# Patient Record
Sex: Female | Born: 1970 | Race: White | Hispanic: No | Marital: Married | Smoking: Never smoker
Health system: Southern US, Community
[De-identification: ages and names within clinical notes are randomized; demographics above are authoritative.]

## PROBLEM LIST (undated history)

## (undated) DIAGNOSIS — I639 Cerebral infarction, unspecified: Secondary | ICD-10-CM

## (undated) DIAGNOSIS — I10 Essential (primary) hypertension: Secondary | ICD-10-CM

---

## 2017-01-25 ENCOUNTER — Other Ambulatory Visit: Payer: Self-pay | Admitting: Orthopedic Surgery

## 2017-01-25 DIAGNOSIS — R531 Weakness: Secondary | ICD-10-CM

## 2017-01-25 DIAGNOSIS — R52 Pain, unspecified: Secondary | ICD-10-CM

## 2017-01-30 ENCOUNTER — Ambulatory Visit
Admission: RE | Admit: 2017-01-30 | Discharge: 2017-01-30 | Disposition: A | Discharge status: Home / Self Care | Payer: BLUE CROSS/BLUE SHIELD | Source: Ambulatory Visit | Attending: Orthopedic Surgery | Admitting: Orthopedic Surgery

## 2017-01-30 DIAGNOSIS — R52 Pain, unspecified: Secondary | ICD-10-CM

## 2017-01-30 DIAGNOSIS — R531 Weakness: Secondary | ICD-10-CM

## 2020-03-01 ENCOUNTER — Observation Stay (HOSPITAL_COMMUNITY): Payer: Managed Care, Other (non HMO)

## 2020-03-01 ENCOUNTER — Other Ambulatory Visit: Payer: Self-pay

## 2020-03-01 ENCOUNTER — Encounter: Payer: Self-pay | Admitting: Physician Assistant

## 2020-03-01 ENCOUNTER — Emergency Department (HOSPITAL_COMMUNITY): Payer: Managed Care, Other (non HMO)

## 2020-03-01 ENCOUNTER — Observation Stay (HOSPITAL_COMMUNITY)
Admission: EM | Admit: 2020-03-01 | Discharge: 2020-03-02 | Disposition: A | Discharge status: Home / Self Care | Payer: Managed Care, Other (non HMO) | Attending: Family Medicine | Admitting: Family Medicine

## 2020-03-01 ENCOUNTER — Ambulatory Visit
Admission: EM | Admit: 2020-03-01 | Discharge: 2020-03-01 | Disposition: A | Discharge status: Home / Self Care | Payer: Managed Care, Other (non HMO) | Source: Home / Self Care

## 2020-03-01 ENCOUNTER — Encounter (HOSPITAL_COMMUNITY): Payer: Self-pay | Admitting: Emergency Medicine

## 2020-03-01 DIAGNOSIS — Z20822 Contact with and (suspected) exposure to covid-19: Secondary | ICD-10-CM | POA: Diagnosis not present

## 2020-03-01 DIAGNOSIS — I639 Cerebral infarction, unspecified: Secondary | ICD-10-CM | POA: Diagnosis present

## 2020-03-01 DIAGNOSIS — E876 Hypokalemia: Secondary | ICD-10-CM | POA: Diagnosis present

## 2020-03-01 DIAGNOSIS — Z885 Allergy status to narcotic agent status: Secondary | ICD-10-CM | POA: Diagnosis not present

## 2020-03-01 DIAGNOSIS — I10 Essential (primary) hypertension: Secondary | ICD-10-CM | POA: Diagnosis not present

## 2020-03-01 DIAGNOSIS — I6389 Other cerebral infarction: Secondary | ICD-10-CM | POA: Diagnosis not present

## 2020-03-01 DIAGNOSIS — R82998 Other abnormal findings in urine: Secondary | ICD-10-CM | POA: Insufficient documentation

## 2020-03-01 DIAGNOSIS — R8290 Unspecified abnormal findings in urine: Secondary | ICD-10-CM | POA: Diagnosis present

## 2020-03-01 DIAGNOSIS — Z79899 Other long term (current) drug therapy: Secondary | ICD-10-CM | POA: Diagnosis not present

## 2020-03-01 DIAGNOSIS — R29810 Facial weakness: Secondary | ICD-10-CM | POA: Insufficient documentation

## 2020-03-01 HISTORY — DX: Essential (primary) hypertension: I10

## 2020-03-01 HISTORY — DX: Cerebral infarction, unspecified: I63.9

## 2020-03-01 LAB — URINALYSIS, ROUTINE W REFLEX MICROSCOPIC
Bilirubin Urine: NEGATIVE
Glucose, UA: NEGATIVE mg/dL
Hgb urine dipstick: NEGATIVE
Ketones, ur: NEGATIVE mg/dL
Leukocytes,Ua: NEGATIVE
Nitrite: POSITIVE — AB
Protein, ur: NEGATIVE mg/dL
Specific Gravity, Urine: 1.006 (ref 1.005–1.030)
pH: 7 (ref 5.0–8.0)

## 2020-03-01 LAB — RAPID URINE DRUG SCREEN, HOSP PERFORMED
Amphetamines: NOT DETECTED
Barbiturates: NOT DETECTED
Benzodiazepines: NOT DETECTED
Cocaine: NOT DETECTED
Opiates: NOT DETECTED
Tetrahydrocannabinol: NOT DETECTED

## 2020-03-01 LAB — CBC WITH DIFFERENTIAL/PLATELET
Abs Immature Granulocytes: 0.02 10*3/uL (ref 0.00–0.07)
Basophils Absolute: 0 10*3/uL (ref 0.0–0.1)
Basophils Relative: 1 %
Eosinophils Absolute: 0.1 10*3/uL (ref 0.0–0.5)
Eosinophils Relative: 1 %
HCT: 40.4 % (ref 36.0–46.0)
Hemoglobin: 13.5 g/dL (ref 12.0–15.0)
Immature Granulocytes: 0 %
Lymphocytes Relative: 15 %
Lymphs Abs: 1 10*3/uL (ref 0.7–4.0)
MCH: 30.2 pg (ref 26.0–34.0)
MCHC: 33.4 g/dL (ref 30.0–36.0)
MCV: 90.4 fL (ref 80.0–100.0)
Monocytes Absolute: 0.5 10*3/uL (ref 0.1–1.0)
Monocytes Relative: 8 %
Neutro Abs: 5 10*3/uL (ref 1.7–7.7)
Neutrophils Relative %: 75 %
Platelets: 308 10*3/uL (ref 150–400)
RBC: 4.47 MIL/uL (ref 3.87–5.11)
RDW: 12.9 % (ref 11.5–15.5)
WBC: 6.6 10*3/uL (ref 4.0–10.5)
nRBC: 0 % (ref 0.0–0.2)

## 2020-03-01 LAB — HIV ANTIBODY (ROUTINE TESTING W REFLEX): HIV Screen 4th Generation wRfx: NONREACTIVE

## 2020-03-01 LAB — BASIC METABOLIC PANEL
Anion gap: 9 (ref 5–15)
BUN: 12 mg/dL (ref 6–20)
CO2: 24 mmol/L (ref 22–32)
Calcium: 8.7 mg/dL — ABNORMAL LOW (ref 8.9–10.3)
Chloride: 107 mmol/L (ref 98–111)
Creatinine, Ser: 0.59 mg/dL (ref 0.44–1.00)
GFR calc Af Amer: >60 mL/min (ref >60)
GFR calc non Af Amer: >60 mL/min (ref >60)
Glucose, Bld: 131 mg/dL — ABNORMAL HIGH (ref 70–99)
Potassium: 3.3 mmol/L — ABNORMAL LOW (ref 3.5–5.1)
Sodium: 140 mmol/L (ref 135–145)

## 2020-03-01 LAB — SARS CORONAVIRUS 2 (TAT 6-24 HRS): SARS Coronavirus 2: NEGATIVE

## 2020-03-01 MED ORDER — ASPIRIN EC 81 MG PO TBEC
81.0000 mg | DELAYED_RELEASE_TABLET | Freq: Every day | ORAL | Status: DC
  Administered 2020-03-01 – 2020-03-02 (×2): 81 mg via ORAL
  Filled 2020-03-01 (×2): qty 1

## 2020-03-01 MED ORDER — ACETAMINOPHEN 650 MG RE SUPP: 650.0000 mg | RECTAL | Status: DC | PRN

## 2020-03-01 MED ORDER — IOHEXOL 350 MG/ML SOLN
50.0000 mL | Freq: Once | INTRAVENOUS | Status: AC | PRN
  Administered 2020-03-01: 50 mL via INTRAVENOUS

## 2020-03-01 MED ORDER — ACETAMINOPHEN 325 MG PO TABS: 650.0000 mg | ORAL_TABLET | ORAL | Status: DC | PRN

## 2020-03-01 MED ORDER — POTASSIUM CHLORIDE 20 MEQ PO PACK
20.0000 meq | PACK | Freq: Once | ORAL | Status: AC
  Administered 2020-03-01: 20 meq via ORAL
  Filled 2020-03-01 (×2): qty 1

## 2020-03-01 MED ORDER — ATORVASTATIN CALCIUM 80 MG PO TABS
80.0000 mg | ORAL_TABLET | Freq: Every day | ORAL | Status: DC
  Administered 2020-03-01: 80 mg via ORAL
  Filled 2020-03-01: qty 1

## 2020-03-01 MED ORDER — ASPIRIN 325 MG PO TABS
325.0000 mg | ORAL_TABLET | Freq: Once | ORAL | Status: AC
  Administered 2020-03-01: 325 mg via ORAL
  Filled 2020-03-01: qty 1

## 2020-03-01 MED ORDER — CLOPIDOGREL BISULFATE 75 MG PO TABS
75.0000 mg | ORAL_TABLET | Freq: Every day | ORAL | Status: DC
  Administered 2020-03-01 – 2020-03-02 (×2): 75 mg via ORAL
  Filled 2020-03-01 (×2): qty 1

## 2020-03-01 MED ORDER — STROKE: EARLY STAGES OF RECOVERY BOOK
Freq: Once | Status: AC
  Filled 2020-03-01: qty 1

## 2020-03-01 MED ORDER — ENOXAPARIN SODIUM 40 MG/0.4ML ~~LOC~~ SOLN
40.0000 mg | SUBCUTANEOUS | Status: DC
  Administered 2020-03-01: 40 mg via SUBCUTANEOUS
  Filled 2020-03-01: qty 0.4

## 2020-03-01 MED ORDER — ACETAMINOPHEN 160 MG/5ML PO SOLN: 650.0000 mg | ORAL | Status: DC | PRN

## 2020-03-01 NOTE — ED Notes (Signed)
Patient presented to UC with facial drooping.  She was assessed by provider and referred to [facility] for further evaluation of her symptoms.  Patient declined EMS and agrees to transport by personal vehicle.

## 2020-03-01 NOTE — ED Provider Notes (Signed)
[facility] EMERGENCY DEPARTMENT Provider Note   CSN: [id number] Arrival date & time: 03/01/20  1039     History Chief Complaint  Patient presents with  . Facial Droop    [facility] is a 49 y.o. female.  HPI  She presents for evaluation of "a funny feeling in her lips," described as left-sided and a noticeable droop of her left face.  She noticed the sensation in her lips last night and the facial droop this more.  She denies headache, blurred vision, change in taste or smell, fever, chills, cough, shortness of breath, chest pain, difficulty walking or any numb feeling.  No prior similar problems.  There are no other known modifying factors.      Past Medical History:  Diagnosis Date  . Hypertension     There are no problems to display for this patient.   History reviewed. No pertinent surgical history.   OB History   No obstetric history on file.     No family history on file.  Social History   Tobacco Use  . Smoking status: Not on file  Substance Use Topics  . Alcohol use: Not on file  . Drug use: Not on file    Home Medications Prior to Admission medications   Medication Sig Start Date End Date Taking? Authorizing Provider  labetalol (NORMODYNE) 100 MG tablet Take 100 mg by mouth 2 (two) times daily.   Yes [provider]  Oxymetazoline HCl (NASAL SPRAY NA) Place 1 spray into the nose daily as needed (allergies).   Yes [provider]    Allergies    Demerol [meperidine]  Review of Systems   Review of Systems  All other systems reviewed and are negative.   Physical Exam Updated Vital Signs BP (!) 163/101   Pulse 71   Temp 98 F (36.7 C) (Oral)   Resp 14   SpO2 99%   Physical Exam Vitals and nursing note reviewed.  Constitutional:      General: She is not in acute distress.    Appearance: She is well-developed. She is not ill-appearing, toxic-appearing or diaphoretic.  HENT:     Head: Normocephalic  and atraumatic.     Right Ear: External ear normal.     Left Ear: External ear normal.  Eyes:     Conjunctiva/sclera: Conjunctivae normal.     Pupils: Pupils are equal, round, and reactive to light.  Neck:     Trachea: Phonation normal.  Cardiovascular:     Rate and Rhythm: Normal rate.  Pulmonary:     Effort: Pulmonary effort is normal.  Abdominal:     General: There is no distension.  Musculoskeletal:        General: Normal range of motion.     Cervical back: Normal range of motion and neck supple.  Skin:    General: Skin is warm and dry.  Neurological:     Mental Status: She is alert and oriented to person, place, and time.     Sensory: No sensory deficit.     Motor: No abnormal muscle tone.     Coordination: Coordination normal.     Comments: Left midface weakness, altered smile.  No numbness of face, hands or legs.  Psychiatric:        Mood and Affect: Mood normal.        Behavior: Behavior normal.        Thought Content: Thought content normal.  Judgment: Judgment normal.     ED Results / Procedures / Treatments   Labs (all labs ordered are listed, but only abnormal results are displayed) Labs Reviewed  BASIC METABOLIC PANEL - Abnormal; Notable for the following components:      Result Value   Potassium 3.3 (*)    Glucose, Bld 131 (*)    Calcium 8.7 (*)    All other components within normal limits  SARS CORONAVIRUS 2 (TAT 6-24 HRS)  CBC WITH DIFFERENTIAL/PLATELET  RAPID URINE DRUG SCREEN, HOSP PERFORMED  URINALYSIS, ROUTINE W REFLEX MICROSCOPIC  CBG MONITORING, ED    EKG EKG Interpretation  Date/Time:  Sunday March 01 2020 11:00:04 EST Ventricular Rate:  74 PR Interval:  138 QRS Duration: 82 QT Interval:  422 QTC Calculation: 468 R Axis:   93 Text Interpretation: Normal sinus rhythm Rightward axis Borderline ECG No old tracing to compare Confirmed by Mancel Bale [phone]) on 03/01/2020 2:08:13 PM   Radiology MR BRAIN WO CONTRAST  Result  Date: 03/01/2020 CLINICAL DATA:  Left-sided facial droop EXAM: MRI HEAD WITHOUT CONTRAST TECHNIQUE: Multiplanar, multiecho pulse sequences of the brain and surrounding structures were obtained without intravenous contrast. COMPARISON:  None. FINDINGS: Brain: There is a 13 mm area of restricted diffusion within the right centrum semiovale. There is no evidence of intracranial hemorrhage. There is no intracranial mass, mass effect, or edema. There is no hydrocephalus or extra-axial fluid collection. Patchy T2 hyperintensity in the supratentorial white matter is nonspecific but may reflect mild chronic microvascular ischemic changes. Ventricles and sulci are normal in size and configuration. Vascular: Major vessel flow voids at the skull base are preserved. Skull and upper cervical spine: Normal marrow signal is preserved. Sinuses/Orbits: Trace mucosal thickening.  Orbits are unremarkable. Other: Sella is unremarkable.  Mastoid air cells are clear. IMPRESSION: Acute small vessel infarction involving the right centrum semiovale. Mild chronic microvascular ischemic changes. Electronically Signed   By: Guadlupe Spanish M.D.   On: 03/01/2020 13:13    Procedures Procedures (including critical care time)  Medications Ordered in ED Medications - No data to display  ED Course  I have reviewed the triage vital signs and the nursing notes.  Pertinent labs & imaging results that were available during my care of the patient were reviewed by me and considered in my medical decision making (see chart for details).  Clinical Course as of Mar 01 1417  Wynelle Link Mar 01, 2020  1144 The patient was initially seen at 1055 by Dr. Criss Alvine to screen for acute CVA.  He felt that this could be evaluated with MRI imaging, which was ordered.   [EW]  1407 I have communicated with the neuro hospitalist, Dr. Laurence Slate.  He will see the patient as a Research scientist (medical) and requests hospitalist admission.   [EW]    Clinical Course User Index [EW]  Mancel Bale, MD   MDM Rules/Calculators/A&P                       Patient Vitals for the past 24 hrs:  BP Temp Temp src Pulse Resp SpO2  03/01/20 1400 (!) 163/101 -- -- 71 14 99 %  03/01/20 1215 (!) 161/100 -- -- 66 12 97 %  03/01/20 1200 (!) 159/90 -- -- 70 13 97 %  03/01/20 1155 (!) 150/90 -- -- 66 14 98 %  03/01/20 1049 (!) 161/91 98 F (36.7 C) Oral 75 20 99 %    2:13 PM Reevaluation with update and discussion.  After initial assessment and treatment, an updated evaluation reveals no change in clinical status.  Findings discussed with the patient and all questions were answered. Mancel Bale   Medical Decision Making: CVA, subacute, low NIH, not requiring thrombolytics.  Patient requires further evaluation for stratification.  She does have persistent likely untreated hypertension, while in the emergency department.  Doubt hypertensive urgency.  [facility] was evaluated in Emergency Department on 03/01/2020 for the symptoms described in the history of present illness. She was evaluated in the context of the global COVID-19 pandemic, which necessitated consideration that the patient might be at risk for infection with the SARS-CoV-2 virus that causes COVID-19. Institutional protocols and algorithms that pertain to the evaluation of patients at risk for COVID-19 are in a state of rapid change based on information released by regulatory bodies including the CDC and federal and state organizations. These policies and algorithms were followed during the patient's care in the ED.   CRITICAL CARE- yes Performed by: Mancel Bale   Nursing Notes Reviewed/ Care Coordinated Applicable Imaging Reviewed Interpretation of Laboratory Data incorporated into ED treatment   2:18 PM-Consult complete with hospitalist. Patient case explained and discussed.  She agrees to admit patient for further evaluation and treatment. Call ended at 1450 p.m.   Final Clinical Impression(s) / ED  Diagnoses Final diagnoses:  Cerebral infarction, unspecified mechanism (HCC)  Hypertension, unspecified type    Rx / DC Orders ED Discharge Orders    None       Mancel Bale, MD 03/01/20 1504

## 2020-03-01 NOTE — ED Notes (Signed)
Pt transported to CT ?

## 2020-03-01 NOTE — ED Notes (Signed)
Attempted to give report and was told nurse will call me back, that she is in a middle of a procedure.

## 2020-03-01 NOTE — Discharge Instructions (Addendum)
49 year old female with history of HTN comes in for 2-hour history of left-sided facial droop.  Denies ataxia, aphasia.  Denies one-sided weakness, dizziness.  Denies recent URI symptoms such as cough, congestion, sore throat.  Left-sided facial droop.  Strength 5/5 BUE/BLE.  Sensation intact ankle bilaterally.  Normal finger-to-nose, rapid movement.  Normal gait and coordination.  Given 2-hour history of left-sided facial droop, discussed worrisome for CVA, discharge to ED for  further evaluation.

## 2020-03-01 NOTE — ED Triage Notes (Signed)
Pt arrives with L sided facial droop that she noticed upon waking this morning. She states that around 6 last night, while getting ready for dinner, she noticed her mouth felt strange but no drooping was noted. Pt states that upon waking up this morning at 0730, her mouth felt strange and she noticed that the L side of her face was drooping. Pt a/ox4, speech clear, grip strength equal, no arm drift. Denies any other symptoms or recent illness.

## 2020-03-01 NOTE — Consult Note (Addendum)
Neurology Consultation  Reason for Consult: lacunar stroke  Referring Physician: Dr. Criss Alvine  CC: L facial droop    History is obtained from: Patient  HPI: [facility] is a 49 y.o. female with PMH of HTN, otherwise healthy who presented with complaints of L facial numbness that started 3/6 around 1900. Patient awoke this morning on 3/7 and noticed L facial droop + weakness and subsequently presented to the ER. No HA, forehead involvement, vision changes, extremity weakness or paresthesias. Patient remained hemodynamically stable otherwise. Neurology team was consulted to evaluate for possible TIA vs CVA vs bell's palsy.   ED course Relevant labs include - UA positive nitrite Hypokalemia (3.3) Hypocalcemia (8.7)  Imaging shows-  CTA: no intracranial large vessel or proximal high grade stenoses MRI brain: acute R sided lacunar infarct  Chart review: N/A  Work up that has been done: as above  LKW: 3/6 1900 tpa given?: no, outside tpa window    Past Medical History:  Diagnosis Date  . Hypertension    1-No significant post stroke disability and can perform usual duties with stroke symptoms Essential (primary) hypertension   No family history on file.  Social History:   has no history on file for tobacco, alcohol, and drug.   Medications  Current Facility-Administered Medications:  .   stroke: mapping our early stages of recovery book, , Does not apply, Once, Pahwani, Rinka R, MD .  acetaminophen (TYLENOL) tablet 650 mg, 650 mg, Oral, Q4H PRN **OR** acetaminophen (TYLENOL) 160 MG/5ML solution 650 mg, 650 mg, Per Tube, Q4H PRN **OR** acetaminophen (TYLENOL) suppository 650 mg, 650 mg, Rectal, Q4H PRN, Pahwani, Rinka R, MD .  aspirin EC tablet 81 mg, 81 mg, Oral, Daily, Bhuvaneswaran, Anitha, PA-C .  atorvastatin (LIPITOR) tablet 80 mg, 80 mg, Oral, q1800, Pahwani, Rinka R, MD .  clopidogrel (PLAVIX) tablet 75 mg, 75 mg, Oral, Daily, Bhuvaneswaran, Anitha, PA-C .   enoxaparin (LOVENOX) injection 40 mg, 40 mg, Subcutaneous, Q24H, Pahwani, Rinka R, MD .  potassium chloride (KLOR-CON) packet 20 mEq, 20 mEq, Oral, Once, Pahwani, Rinka R, MD  ROS:   General ROS: negative for - chills, fatigue, fever, night sweats, weight gain or weight loss Psychological ROS: negative for - behavioral disorder, hallucinations, memory difficulties, mood swings or suicidal ideation Ophthalmic ROS: negative for - blurry vision, double vision, eye pain or loss of vision ENT ROS: negative for - epistaxis, nasal discharge, oral lesions, sore throat, tinnitus or vertigo Allergy and Immunology ROS: negative for - hives or itchy/watery eyes Hematological and Lymphatic ROS: negative for - bleeding problems, bruising or swollen lymph nodes Endocrine ROS: negative for - galactorrhea, hair pattern changes, polydipsia/polyuria or temperature intolerance Respiratory ROS: negative for - cough, hemoptysis, shortness of breath or wheezing Cardiovascular ROS: negative for - chest pain, dyspnea on exertion, edema or irregular heartbeat Gastrointestinal ROS: negative for - abdominal pain, diarrhea, hematemesis, nausea/vomiting or stool incontinence Genito-Urinary ROS: negative for - dysuria, hematuria, incontinence or urinary frequency/urgency Musculoskeletal ROS: negative for - joint swelling or muscular weakness Neurological ROS: as noted in HPI Dermatological ROS: negative for rash and skin lesion changes  Exam: Current vital signs: BP (!) 165/93 (BP Location: Right Arm)   Pulse 70   Temp 98.1 F (36.7 C) (Oral)   Resp 16   SpO2 98%  Vital signs in last 24 hours: Temp:  [98 F (36.7 C)-98.1 F (36.7 C)] 98.1 F (36.7 C) (03/07 1657) Pulse Rate:  [66-77] 70 (03/07 1657) Resp:  [12-20] 16 (  03/07 1657) BP: (135-165)/(90-102) 165/93 (03/07 1657) SpO2:  [95 %-99 %] 98 % (03/07 1657)   Constitutional: Appears well-developed and well-nourished.  Psych: Affect appropriate to  situation Eyes: No scleral injection HENT: No OP obstrucion Head: Normocephalic.  Cardiovascular: Normal rate and regular rhythm.  Respiratory: Effort normal, non-labored breathing GI: Soft.  No distension. There is no tenderness.  Skin: WDI  Neuro: Mental Status: Patient is awake, alert, oriented to person, place, month, year, and situation. Speech- intact naming, repeating, comprehension Patient is able to give a clear and coherent history. Cranial Nerves: II: Visual Fields are full.  III,IV, VI: EOMI without ptosis or diploplia. Pupils equal, round and reactive to light V: Facial sensation is symmetric to temperature VII: L facial droop and nasolabial flattening  VIII: hearing is intact to voice X: Palat elevates symmetrically XI: Shoulder shrug is symmetric. XII: tongue is midline without atrophy or fasciculations.  Motor: Tone is normal. Bulk is normal. 5/5 strength was present in all four extremities.  Drift aterixis Sensory: Sensation is symmetric to light touch and temperature in the arms and legs. DSS Deep Tendon Reflexes: 2+ and symmetric in the biceps and patellae.  Plantars: Toes are downgoing bilaterally.  Cerebellar: FNF and HKS are intact bilaterally  NIHSS 1   Labs I have reviewed labs in epic and the results pertinent to this consultation are:  CBC    Component Value Date/Time   WBC 6.6 03/01/2020 1104   RBC 4.47 03/01/2020 1104   HGB 13.5 03/01/2020 1104   HCT 40.4 03/01/2020 1104   PLT 308 03/01/2020 1104   MCV 90.4 03/01/2020 1104   MCH 30.2 03/01/2020 1104   MCHC 33.4 03/01/2020 1104   RDW 12.9 03/01/2020 1104   LYMPHSABS 1.0 03/01/2020 1104   MONOABS 0.5 03/01/2020 1104   EOSABS 0.1 03/01/2020 1104   BASOSABS 0.0 03/01/2020 1104    CMP     Component Value Date/Time   NA 140 03/01/2020 1104   K 3.3 (L) 03/01/2020 1104   CL 107 03/01/2020 1104   CO2 24 03/01/2020 1104   GLUCOSE 131 (H) 03/01/2020 1104   BUN 12 03/01/2020 1104    CREATININE 0.59 03/01/2020 1104   CALCIUM 8.7 (L) 03/01/2020 1104   GFRNONAA >60 03/01/2020 1104   GFRAA >60 03/01/2020 1104    Lipid Panel  No results found for: CHOL, TRIG, HDL, CHOLHDL, VLDL, LDLCALC, LDLDIRECT   Imaging I have reviewed the images obtained:  CTA head/neck: 1. A small acute infarct within the right centrum semiovale was better appreciated on same-day brain MRI. 2. Mild chronic small vessel ischemic disease. 3. The bilateral common carotid, internal carotid and vertebral arteries are patent within the neck without stenosis. Minimal mixed plaque within the proximal ICAs.  MRI examination of the brain Acute small vessel infarction involving the right centrum semiovale. Mild chronic microvascular ischemic changes.     Posey Pronto PA-C [facility] [phone]  NEUROHOSPITALIST ADDENDUM Performed a face to face diagnostic evaluation.   I have reviewed the contents of history and physical exam as documented by PA/ARNP/Resident and agree with above documentation.  I have discussed and formulated the above plan as documented. Edits to the note have been made as needed.  49 yo F with PMH of HTN presenting with new L facial droop, found to have R sided lacunar infarct.  CT angiogram shows no high-grade stenosis.  Etiology of stroke likely small vessel disease secondary to hypertension, other risk factor work-up pending.  Impression: Acute infarct of R centrum semiovale UTI (nitrite positive)  Etiology: Suspect small vessel disease  Recommend  #Transthoracic Echo  # Start patient on aspirin 81 mg and Plavix 75 mg #Start or continue Atorvastatin 40 mg/other high intensity statin # BP goal: permissive HTN upto 220/120 mmHg ( 185/110 if patient has CHF, CKD) # HBAIC and Lipid profile # Telemetry monitoring # Frequent neuro checks # NPO until passes stroke swallow screen  Nikai Quest MD [facility] [phone]      Please page stroke NP  Or  PA  Or MD from 8am -4 pm  as this patient from this time will be  followed by the stroke.   You can look them up on [email or web address]  Password [email or web address]

## 2020-03-01 NOTE — ED Provider Notes (Signed)
MSE was initiated and I personally evaluated the patient and placed orders (if any) at  10:55 AM on March 01, 2020.  49 year old female presents with left-sided facial droop and weakness.  She noticed it first with tingling in her lips last night.  This morning noticed more prominent facial droop.  No headache, vision changes or weakness in her extremities.  Sent from urgent care.  Technically last known normal is 7 PM last night when she felt the tingling.  This is probably Bell's palsy but on exam it is very subtle.  There is no forehead involvement or at least it is so minimal that it is hard to distinguish. Given this, will get labs and MRI.   The patient appears stable so that the remainder of the MSE may be completed by another provider.   Pricilla Loveless, MD 03/01/20 1056

## 2020-03-01 NOTE — Progress Notes (Signed)
Patient arrived to unit. Alert/oriented x4. MD orders acknowledged. Telemetry verified. Bed left in lowest position, call bell within reach. Will continue to monitor. Melony Overly, RN

## 2020-03-01 NOTE — ED Notes (Signed)
Pt transported to MRI 

## 2020-03-01 NOTE — ED Notes (Signed)
Patient evaluated by Dr. Criss Alvine who will place appropriate orders.

## 2020-03-01 NOTE — H&P (Signed)
History and Physical    Melissa Poole BDZ:[id number] DOB: [birth year] DOA: 03/01/2020  PCP: Lois Huxley, PA  Patient coming from: Home I have personally briefly reviewed patient's old medical records in [place]  Chief Complaint: Slurred speech and left-sided facial droop  HPI: Melissa Poole is a 49 y.o. female with medical history significant of hypertension, seasonal allergies presents to emergency department due to left-sided facial droop which she noticed last night and slurred speech this morning.  Patient tells me that last night while she was applying lipstick she noticed left-sided facial droop however this morning she noticed slurred speech.  She denies headache, blurry vision, loss of consciousness, head trauma, seizure, aphasia, ataxia, numbness weakness tingling sensation in extremities, history of similar symptoms in the past, chest pain, shortness of breath, palpitation, leg swelling, fever, chills, cough, congestion, urinary or bowel changes.    No history of smoking, alcohol, recent drug use.  She takes labetalol for her blood pressure and over-the-counter nasal spray for seasonal allergies.    ED Course: Upon arrival to ED: Patient's blood pressure elevated, MRI shows acute small vessel infarction involving the right centrum semiovale.  BMP shows potassium of 3.3, UA, UDS, COVID-19 pending.  EDP consulted neurology for further management.  Tried hospitalist consulted for the admission.    Review of Systems: As per HPI otherwise negative.    Past Medical History:  Diagnosis Date  . Hypertension     History reviewed. No pertinent surgical history.   has no history on file for tobacco, alcohol, and drug.  Allergies  Allergen Reactions  . Demerol [Meperidine] Rash    No family history on file.  Prior to Admission medications   Medication Sig Start Date End Date Taking? Authorizing Provider  labetalol (NORMODYNE) 100 MG tablet Take 100 mg by mouth 2 (two)  times daily.   Yes [provider]  Oxymetazoline HCl (NASAL SPRAY NA) Place 1 spray into the nose daily as needed (allergies).   Yes [provider]    Physical Exam: Vitals:   03/01/20 1200 03/01/20 1215 03/01/20 1400 03/01/20 1430  BP: (!) 159/90 (!) 161/100 (!) 163/101 (!) 159/93  Pulse: 70 66 71 72  Resp: 13 12 14 16   Temp:      TempSrc:      SpO2: 97% 97% 99% 97%    Constitutional: NAD, calm, comfortable Eyes: PERRL, lids and conjunctivae normal ENMT: Mucous membranes are moist. Posterior pharynx clear of any exudate or lesions.Normal dentition.  Neck: normal, supple, no masses, no thyromegaly Respiratory: clear to auscultation bilaterally, no wheezing, no crackles. Normal respiratory effort. No accessory muscle use.  Cardiovascular: Regular rate and rhythm, no murmurs / rubs / gallops. No extremity edema. 2+ pedal pulses. No carotid bruits.  Abdomen: no tenderness, no masses palpated. No hepatosplenomegaly. Bowel sounds positive.  Musculoskeletal: no clubbing / cyanosis. No joint deformity upper and lower extremities. Good ROM, no contractures. Normal muscle tone.  Skin: no rashes, lesions, ulcers. No induration Neurologic: Left-sided facial droop noted, has slurred speech, power 5 out of 5 in bilateral upper and lower extremities, sensation intact. Psychiatric: Normal judgment and insight. Alert and oriented x 3. Normal mood.    Labs on Admission: I have personally reviewed following labs and imaging studies  CBC: Recent Labs  Lab 03/01/20 1104  WBC 6.6  NEUTROABS 5.0  HGB 13.5  HCT 40.4  MCV 90.4  PLT 341   Basic Metabolic Panel: Recent Labs  Lab 03/01/20 1104  NA 140  K 3.3*  CL 107  CO2 24  GLUCOSE 131*  BUN 12  CREATININE 0.59  CALCIUM 8.7*   GFR: CrCl cannot be calculated (Unknown ideal weight.). Liver Function Tests: No results for input(s): AST, ALT, ALKPHOS, BILITOT, PROT, ALBUMIN in the last 168 hours. No results for  input(s): LIPASE, AMYLASE in the last 168 hours. No results for input(s): AMMONIA in the last 168 hours. Coagulation Profile: No results for input(s): INR, PROTIME in the last 168 hours. Cardiac Enzymes: No results for input(s): CKTOTAL, CKMB, CKMBINDEX, TROPONINI in the last 168 hours. BNP (last 3 results) No results for input(s): PROBNP in the last 8760 hours. HbA1C: No results for input(s): HGBA1C in the last 72 hours. CBG: No results for input(s): GLUCAP in the last 168 hours. Lipid Profile: No results for input(s): CHOL, HDL, LDLCALC, TRIG, CHOLHDL, LDLDIRECT in the last 72 hours. Thyroid Function Tests: No results for input(s): TSH, T4TOTAL, FREET4, T3FREE, THYROIDAB in the last 72 hours. Anemia Panel: No results for input(s): VITAMINB12, FOLATE, FERRITIN, TIBC, IRON, RETICCTPCT in the last 72 hours. Urine analysis:    Component Value Date/Time   COLORURINE YELLOW 03/01/2020 1436   APPEARANCEUR HAZY (A) 03/01/2020 1436   LABSPEC 1.006 03/01/2020 1436   PHURINE 7.0 03/01/2020 1436   GLUCOSEU NEGATIVE 03/01/2020 1436   HGBUR NEGATIVE 03/01/2020 1436   BILIRUBINUR NEGATIVE 03/01/2020 1436   KETONESUR NEGATIVE 03/01/2020 1436   PROTEINUR NEGATIVE 03/01/2020 1436   NITRITE POSITIVE (A) 03/01/2020 1436   LEUKOCYTESUR NEGATIVE 03/01/2020 1436    Radiological Exams on Admission: MR BRAIN WO CONTRAST  Result Date: 03/01/2020 CLINICAL DATA:  Left-sided facial droop EXAM: MRI HEAD WITHOUT CONTRAST TECHNIQUE: Multiplanar, multiecho pulse sequences of the brain and surrounding structures were obtained without intravenous contrast. COMPARISON:  None. FINDINGS: Brain: There is a 13 mm area of restricted diffusion within the right centrum semiovale. There is no evidence of intracranial hemorrhage. There is no intracranial mass, mass effect, or edema. There is no hydrocephalus or extra-axial fluid collection. Patchy T2 hyperintensity in the supratentorial white matter is nonspecific but may  reflect mild chronic microvascular ischemic changes. Ventricles and sulci are normal in size and configuration. Vascular: Major vessel flow voids at the skull base are preserved. Skull and upper cervical spine: Normal marrow signal is preserved. Sinuses/Orbits: Trace mucosal thickening.  Orbits are unremarkable. Other: Sella is unremarkable.  Mastoid air cells are clear. IMPRESSION: Acute small vessel infarction involving the right centrum semiovale. Mild chronic microvascular ischemic changes. Electronically Signed   By: Guadlupe Spanish M.D.   On: 03/01/2020 13:13    EKG: Normal sinus rhythm, no ST elevation or depression noted.  Assessment/Plan Principal Problem:   Ischemic stroke [facility]) Active Problems:   Hypertension   Hypokalemia   Ischemic stroke: -Patient presented with left-sided facial droop and slurred speech.  MRI as above. -admit under observation forTelemetry monitoring -Stroke protocol -Allow for permissive hypertension for the first 24-48h - only treat PRN if SBP >220 mmHg or diastolic blood pressure >120. Blood pressures can be gradually normalized to SBP<140 upon discharge. -ASA given.  Ordered atorvastatin 80 mg once daily -Ordered CT angiogram of head and neck, echocardiogram to- rule out PFO, lipid panel, A1c. -Frequent neuro checks -EDP consulted neurology-await recommendation -PT/OT eval, Speech consult -We will keep her n.p.o. until she passes a bedside swallow evaluation.  Hypertension: Blood pressure is elevated -Hold labetalol to allow permissive hypertension.  Monitor blood pressure closely  Hypokalemia: Replenish potassium -Repeat BMP  tomorrow a.m.  DVT prophylaxis: Lovenox, SCD code Status: Full code  family Communication: None present at bedside.  Plan of care discussed with patient in length and she verbalized understanding and agreed with it. Disposition Plan: Likely home tomorrow Consults called: Neurology by EDP Admission status: Observation  Ollen Bowl MD [facility] Pager [phone]  If 7PM-7AM, please contact night-coverage [email or web address] Password [facility]  03/01/2020, 3:16 PM

## 2020-03-01 NOTE — ED Provider Notes (Signed)
50 year old female with history of HTN comes in for 2-hour history of left-sided facial droop.  Denies ataxia, aphasia.  Denies one-sided weakness, dizziness.  Denies recent URI symptoms such as cough, congestion, sore throat.  Left-sided facial droop to the mouth. Normal eyebrow/eye movement.  Strength 5/5 BUE/BLE.  Sensation intact ankle bilaterally.  Normal finger-to-nose, rapid movement.  Normal gait and coordination.  Given 2-hour history of left-sided facial droop sparing eyebrow, discussed worrisome for CVA. Patient without 4 hour window of last normal. Discussed EMS transport, for which patient declined. Risks discussed, patient expresses understanding and will have husband transport to ED. Patient discharged in stable condition.   Belinda Fisher, PA-C 03/01/20 1024

## 2020-03-02 ENCOUNTER — Encounter (HOSPITAL_COMMUNITY): Payer: Self-pay | Admitting: Internal Medicine

## 2020-03-02 ENCOUNTER — Observation Stay (HOSPITAL_BASED_OUTPATIENT_CLINIC_OR_DEPARTMENT_OTHER): Payer: Managed Care, Other (non HMO)

## 2020-03-02 DIAGNOSIS — I6389 Other cerebral infarction: Secondary | ICD-10-CM

## 2020-03-02 DIAGNOSIS — I639 Cerebral infarction, unspecified: Secondary | ICD-10-CM | POA: Diagnosis not present

## 2020-03-02 DIAGNOSIS — I10 Essential (primary) hypertension: Secondary | ICD-10-CM | POA: Diagnosis not present

## 2020-03-02 DIAGNOSIS — R8290 Unspecified abnormal findings in urine: Secondary | ICD-10-CM | POA: Diagnosis present

## 2020-03-02 LAB — BASIC METABOLIC PANEL
Anion gap: 9 (ref 5–15)
BUN: 9 mg/dL (ref 6–20)
CO2: 26 mmol/L (ref 22–32)
Calcium: 9.1 mg/dL (ref 8.9–10.3)
Chloride: 106 mmol/L (ref 98–111)
Creatinine, Ser: 0.68 mg/dL (ref 0.44–1.00)
GFR calc Af Amer: >60 mL/min (ref >60)
GFR calc non Af Amer: >60 mL/min (ref >60)
Glucose, Bld: 161 mg/dL — ABNORMAL HIGH (ref 70–99)
Potassium: 3.4 mmol/L — ABNORMAL LOW (ref 3.5–5.1)
Sodium: 141 mmol/L (ref 135–145)

## 2020-03-02 LAB — LIPID PANEL
Cholesterol: 192 mg/dL (ref 0–200)
HDL: 66 mg/dL (ref >40)
LDL Cholesterol: 109 mg/dL — ABNORMAL HIGH (ref 0–99)
Total CHOL/HDL Ratio: 2.9 RATIO
Triglycerides: 83 mg/dL (ref <150)
VLDL: 17 mg/dL (ref 0–40)

## 2020-03-02 LAB — ECHOCARDIOGRAM COMPLETE

## 2020-03-02 LAB — HEMOGLOBIN A1C
Hgb A1c MFr Bld: 5 % (ref 4.8–5.6)
Mean Plasma Glucose: 96.8 mg/dL

## 2020-03-02 MED ORDER — ASPIRIN 81 MG PO TBEC: 81.0000 mg | DELAYED_RELEASE_TABLET | Freq: Every day | ORAL | Status: AC

## 2020-03-02 MED ORDER — CLOPIDOGREL BISULFATE 75 MG PO TABS: 75.0000 mg | ORAL_TABLET | Freq: Every day | ORAL | 0 refills | Status: AC

## 2020-03-02 MED ORDER — ATORVASTATIN CALCIUM 80 MG PO TABS: 80.0000 mg | ORAL_TABLET | Freq: Every day | ORAL | 0 refills | Status: AC

## 2020-03-02 NOTE — Plan of Care (Signed)
  Problem: Education: Goal: Knowledge of disease or condition will improve Outcome: Progressing Goal: Knowledge of secondary prevention will improve Outcome: Progressing Goal: Knowledge of patient specific risk factors addressed and post discharge goals established will improve Outcome: Progressing Goal: Individualized Educational Video(s) Outcome: Progressing   Problem: Ischemic Stroke/TIA Tissue Perfusion: Goal: Complications of ischemic stroke/TIA will be minimized Outcome: Progressing   Problem: Education: Goal: Knowledge of General Education information will improve Description: Including pain rating scale, medication(s)/side effects and non-pharmacologic comfort measures Outcome: Progressing   Problem: Health Behavior/Discharge Planning: Goal: Ability to manage health-related needs will improve Outcome: Progressing   Problem: Clinical Measurements: Goal: Ability to maintain clinical measurements within normal limits will improve Outcome: Progressing Goal: Will remain free from infection Outcome: Progressing Goal: Diagnostic test results will improve Outcome: Progressing Goal: Respiratory complications will improve Outcome: Progressing Goal: Cardiovascular complication will be avoided Outcome: Progressing   Problem: Activity: Goal: Risk for activity intolerance will decrease Outcome: Progressing   Problem: Nutrition: Goal: Adequate nutrition will be maintained Outcome: Progressing   Problem: Coping: Goal: Level of anxiety will decrease Outcome: Progressing   Problem: Elimination: Goal: Will not experience complications related to bowel motility Outcome: Progressing Goal: Will not experience complications related to urinary retention Outcome: Progressing   Problem: Pain Managment: Goal: General experience of comfort will improve Outcome: Progressing   Problem: Safety: Goal: Ability to remain free from injury will improve Outcome: Progressing   Problem:  Skin Integrity: Goal: Risk for impaired skin integrity will decrease Outcome: Progressing   

## 2020-03-02 NOTE — Progress Notes (Signed)
=   Echocardiogram 2D Echocardiogram has been performed.  Leta Jungling M 03/02/2020, 10:42 AM

## 2020-03-02 NOTE — Evaluation (Signed)
Speech Language Pathology Evaluation Patient Details Name: Melissa Poole MRN: [id number] DOB: 14-Sep-1971 Today's Date: 03/02/2020 Time: 8938-1017 SLP Time Calculation (min) (ACUTE ONLY): 13 min  Problem List:  Patient Active Problem List   Diagnosis Date Noted  . Hypertension   . Ischemic stroke (HCC)   . Hypokalemia    Past Medical History:  Past Medical History:  Diagnosis Date  . Hypertension    Past Surgical History: History reviewed. No pertinent surgical history. HPI:  Melissa Poole is a 49 y.o. female with medical history significant of hypertension, seasonal allergies, presented to ED with left-sided facial droop and slurred speech. MRI revealed acute small vessel infarction involving the right centrum semiovale.    Assessment / Plan / Recommendation Clinical Impression   Patient seen at bedside for speech/language evaluation. Pt oriented x4. Patient's husband present at bedside during this evaluation. Patient with mild L side facial droop. Patient presents with very minimal dysarthria, characterized by minimally reduced rate of speech and very minimal consonant distortion. However,  speech is 100% intelligible. Patient's spouse present and reports he does not experience difficulty understanding patient.  Patient's cognitive-linguistic, receptive and expressive language skills are [facility]. She has good verbal initiation, repetition and denies word-finding difficulty. Patient able to name items in confrontation naming task without difficulty, answer basic and complex yes/no questions and follow multi-step complex verbal commands. Patient is able to recall 3/4 words in delayed verbal recall task. Awareness is intact. Patient and her husband deny any changes in her thinking, memory or problem solving. No further ST is recommended at the inpatient level. ST reviewed BEFAST acronym for stroke symptom awareness.   Patient may consider outpatient ST for dysarthria if symptoms persist.     SLP  Assessment  SLP Recommendation/Assessment: All further Speech Lanaguage Pathology  needs can be addressed in the next venue of care SLP Visit Diagnosis: Dysarthria and anarthria (R47.1)    Follow Up Recommendations  Outpatient SLP;None    Frequency and Duration           SLP Evaluation Cognition  Overall Cognitive Status: Within Functional Limits for tasks assessed Arousal/Alertness: Awake/alert Orientation Level: Oriented X4 Memory: Appears intact Awareness: Appears intact Problem Solving: Appears intact Safety/Judgment: Appears intact       Comprehension  Auditory Comprehension Overall Auditory Comprehension: Appears within functional limits for tasks assessed Yes/No Questions: Within Functional Limits Commands: Within Functional Limits Conversation: Complex Reading Comprehension Reading Status: Not tested    Expression Expression Primary Mode of Expression: Verbal Verbal Expression Overall Verbal Expression: Appears within functional limits for tasks assessed Written Expression Dominant Hand: Right Written Expression: Not tested   Oral / Motor  Oral Motor/Sensory Function Overall Oral Motor/Sensory Function: Mild impairment Facial ROM: Within Functional Limits Facial Symmetry: Abnormal symmetry left Facial Strength: Within Functional Limits Facial Sensation: Reduced left Lingual ROM: Within Functional Limits Lingual Symmetry: Within Functional Limits Lingual Strength: Within Functional Limits Motor Speech Overall Motor Speech: Appears within functional limits for tasks assessed Intelligibility: Intelligible Motor Speech Errors: Not applicable   GO                    Melissa Poole 03/02/2020, 11:38 AM  Melissa Poole, M.Ed., CCC-SLP Speech Therapy Acute Rehabilitation [phone]: Acute Rehab office [phone] - pager

## 2020-03-02 NOTE — Progress Notes (Signed)
Discharge orders received and paperwork prepared. IV site removed. Reviewed discharge education with the pt and spouse at bedside. Pt and spouse both able to teach back BE FAST education that was provided. No other questions at this time. Husband to assist pt out to the car now.

## 2020-03-02 NOTE — Progress Notes (Signed)
OT Cancellation Note and Discharge  Patient Details Name: Abbagale Goguen MRN: [id number] DOB: Dec 17, 1971   Cancelled Treatment:    Reason Eval/Treat Not Completed: OT screened, no needs identified, will sign off Spoke with PT who stated pt is totally independent.  Ignacia Palma, OTR/L Acute Rehab Services Pager [phone] Office [phone]     Evette Georges 03/02/2020, 11:09 AM

## 2020-03-02 NOTE — Evaluation (Signed)
Physical Therapy Evaluation Patient Details Name: Melissa Poole MRN: [id number] DOB: 05-Feb-1971 Today's Date: 03/02/2020   History of Present Illness  Pt is a 49 yo female presenting with left-sided facial droop and slurred speech. MRI shows acute R-sided lacunar infarct. PMH includes HTN and seasonal allergies.  Clinical Impression  Pt in bed upon arrival of PT, agreeable to evaluation at this time. Prior to admission the pt was independent, living with spouse and 3 children. Despite above dx, the pt was able to demo good safety and stability with functional transfers and ambulation in the hallway. The pt was also able to demo good safety and stability on the stairs, and reports she feels she is at her baseline level of function/mobility. Therefore, the pt has no current acute PT needs, and so we will sign off. The pt is safe to return home with supervision from family. If there is a change in status, please feel free to re-consult.      Follow Up Recommendations No PT follow up;Supervision - Intermittent    Equipment Recommendations  None recommended by PT    Recommendations for Other Services       Precautions / Restrictions Precautions Precautions: None Restrictions Weight Bearing Restrictions: No      Mobility  Bed Mobility Overal bed mobility: Independent                Transfers Overall transfer level: Independent               General transfer comment: no assist needed, multiple transfers from different surfaces, no LOB  Ambulation/Gait Ambulation/Gait assistance: Supervision Gait Distance (Feet): 250 Feet Assistive device: None Gait Pattern/deviations: WFL(Within Functional Limits)   Gait velocity interpretation: >2.62 ft/sec, indicative of community ambulatory General Gait Details: pt with no gait deficits, reports she is at her baseline ambulation  Stairs Stairs: Yes Stairs assistance: Independent Stair Management: No rails Number of Stairs:  10 General stair comments: pt performed 3 steps x3 with use of 1 rail, then step ups for 20 sec with use of no rails  Wheelchair Mobility    Modified Rankin (Stroke Patients Only) Modified Rankin (Stroke Patients Only) Pre-Morbid Rankin Score: No symptoms Modified Rankin: Slight disability     Balance Overall balance assessment: Independent                               Standardized Balance Assessment Standardized Balance Assessment : Dynamic Gait Index   Dynamic Gait Index Level Surface: Normal Change in Gait Speed: Normal Gait with Horizontal Head Turns: Normal Gait with Vertical Head Turns: Normal Gait and Pivot Turn: Normal Step Over Obstacle: Normal Step Around Obstacles: Normal Steps: Normal Total Score: 24       Pertinent Vitals/Pain Pain Assessment: No/denies pain    Home Living Family/patient expects to be discharged to:: Private residence Living Arrangements: Spouse/significant other;Children Available Help at Discharge: Family;Available 24 hours/day Type of Home: House Home Access: Stairs to enter Entrance Stairs-Rails: Left Entrance Stairs-Number of Steps: 2 Home Layout: Two level Home Equipment: None Additional Comments: pt previously independent, works as Diplomatic Services operational officer, no equipment at home but did not need any    Prior Function Level of Independence: Independent         Comments: pt driving and working as Programme researcher, broadcasting/film/video   Dominant Hand: Right    Extremity/Trunk Assessment   Upper Extremity Assessment Upper Extremity Assessment: Overall WFL for  tasks assessed    Lower Extremity Assessment Lower Extremity Assessment: Overall WFL for tasks assessed    Cervical / Trunk Assessment Cervical / Trunk Assessment: Normal  Communication   Communication: No difficulties  Cognition Arousal/Alertness: Awake/alert Behavior During Therapy: WFL for tasks assessed/performed Overall Cognitive Status: Within Functional  Limits for tasks assessed                                        General Comments      Exercises     Assessment/Plan    PT Assessment Patent does not need any further PT services  PT Problem List Decreased strength;Decreased mobility;Decreased coordination;Decreased balance       PT Treatment Interventions Gait training;Balance training;Stair training;Functional mobility training;Patient/family education    PT Goals (Current goals can be found in the Care Plan section)  Acute Rehab PT Goals Patient Stated Goal: return home to family PT Goal Formulation: With patient Time For Goal Achievement: 03/16/20 Potential to Achieve Goals: Good    Frequency     Barriers to discharge        Co-evaluation               AM-PAC PT "6 Clicks" Mobility  Outcome Measure Help needed turning from your back to your side while in a flat bed without using bedrails?: None Help needed moving from lying on your back to sitting on the side of a flat bed without using bedrails?: None Help needed moving to and from a bed to a chair (including a wheelchair)?: None Help needed standing up from a chair using your arms (e.g., wheelchair or bedside chair)?: None Help needed to walk in hospital room?: A Little Help needed climbing 3-5 steps with a railing? : A Little 6 Click Score: 22    End of Session Equipment Utilized During Treatment: Gait belt Activity Tolerance: Patient tolerated treatment well Patient left: with call bell/phone within reach(pt ambulating independently to bathroom) Nurse Communication: Mobility status PT Visit Diagnosis: Difficulty in walking, not elsewhere classified (R26.2) Hemiplegia - caused by: Cerebral infarction    Time: 7412-8786 PT Time Calculation (min) (ACUTE ONLY): 20 min   Charges:   PT Evaluation $PT Eval Low Complexity: 1 Low          Karma Ganja, PT, DPT   Acute Rehabilitation Department Pager #: [phone]  Otho Bellows 03/02/2020, 1:14 PM

## 2020-03-02 NOTE — Discharge Summary (Signed)
Physician Discharge Summary  Melissa Poole PXT:[id number] DOB: 05/25/1971 DOA: 03/01/2020  PCP: Wilfrid Lund, PA  Admit date: 03/01/2020 Discharge date: 03/02/2020  Admitted From: home Discharge disposition: home   Recommendations for Outpatient Follow-Up:   1. Follow up with neuro 4-6 weeks as instructed 2. Follow up with PCP 1-2 weeks for evaluation of BP control, potassium level and urinalysis.   Discharge Diagnosis:   Principal Problem:   Ischemic stroke [facility]) Active Problems:   Hypertension   Hypokalemia   Abnormal urinalysis    Discharge Condition: Improved.  Diet recommendation: Low sodium, heart healthy.   Wound care: None.  Code status: Full.   History of Present Illness:   Melissa Poole is a 49 y.o. female with medical history significant of hypertension, seasonal allergies presented to emergency department 3/7 due to left-sided facial droop which she noticed the night night before and slurred speech that morning.  Patient reported the previous night while she was applying lipstick she noticed left-sided facial droop however, the morning of admission she noticed slurred speech.  She denied headache, blurry vision, loss of consciousness, head trauma, seizure, aphasia, ataxia, numbness weakness tingling sensation in extremities, history of similar symptoms in the past, chest pain, shortness of breath, palpitation, leg swelling, fever, chills, cough, congestion, urinary or bowel changes.    No history of smoking, alcohol, recent drug use.  She takes labetalol for her blood pressure and over-the-counter nasal spray for seasonal allergies.     Hospital Course by Problem:   1.  Stroke.  MRI reveals acute infarct of right centrum semiovale likely related to small vessel disease secondary to hypertension. CT angiogram of the head and neck shows no high-grade stenosis.  Lipid panel with an LDL of 109, hemoglobin A1c 5, 2D echo with an EF of 60 to 65%, grade 1  diastolic dysfunction, right ventricular systolic function normal, EKG normal sinus rhythm.  Evaluated by physical therapy, Occupational Therapy, speech therapy all of who said no further therapies needed.  Provided with aspirin and Plavix and statin at 80mg .  Evaluated  by neurology who recommend aspirin and Plavix for 3 weeks then aspirin alone  2.  Hypertension.  Patient states fairly good control of her blood pressure.  Home medications include labetalol.  Blood pressure high end of normal and slightly elevated during hospitalization.  Will allow for permissive hypertension 24 hours and gradually normalize blood pressure with home medications.  #3.  Hypokalemia.  Mild.  Repleted orally.  Recommend close outpatient follow-up with PCP to track potassium level  #4.  Questionable UTI.  Urinalysis with nitrites rare bacteria.  She is asymptomatic.  Recommend outpatient follow-up     Medical Consultants:   Dr. neurology   Discharge Exam:   Vitals:   03/02/20 1136 03/02/20 1308  BP: (!) 164/94   Pulse: 71   Resp: 18   Temp: 98.1 F (36.7 C)   SpO2: 97% 99%   Vitals:   03/02/20 0322 03/02/20 0828 03/02/20 1136 03/02/20 1308  BP: (!) 160/95 (!) 167/110 (!) 164/94   Pulse: 75 76 71   Resp: 17 18 18    Temp: 98.3 F (36.8 C) 98.5 F (36.9 C) 98.1 F (36.7 C)   TempSrc: Oral Oral Oral   SpO2: 99% 99% 97% 99%    General exam: Appears calm and comfortable.  Awake alert no acute distress Respiratory system: Clear to auscultation. Respiratory effort normal. Cardiovascular system: S1 & S2 heard, RRR. No JVD,  rubs, gallops or clicks. No murmurs. Gastrointestinal system: Abdomen is nondistended, soft and nontender. No organomegaly or masses felt. Normal bowel sounds heard. Central nervous system: Alert and oriented.  Slight facial droop.  Speech is ever so slightly slurred slow but clear Extremities: No clubbing,  or cyanosis. No edema. Skin: No rashes, lesions or  ulcers. Psychiatry: Judgement and insight appear normal. Mood & affect appropriate.    The results of significant diagnostics from this hospitalization (including imaging, microbiology, ancillary and laboratory) are listed below for reference.     Procedures and Diagnostic Studies:   CT ANGIO HEAD W OR WO CONTRAST  Result Date: 03/01/2020 CLINICAL DATA:  Stroke, follow-up. EXAM: CT ANGIOGRAPHY HEAD AND NECK TECHNIQUE: Multidetector CT imaging of the head and neck was performed using the standard protocol during bolus administration of intravenous contrast. Multiplanar CT image reconstructions and MIPs were obtained to evaluate the vascular anatomy. Carotid stenosis measurements (when applicable) are obtained utilizing NASCET criteria, using the distal internal carotid diameter as the denominator. CONTRAST:  50mL OMNIPAQUE IOHEXOL 350 MG/ML SOLN COMPARISON:  Brain MRI performed earlier the same day 03/01/2020 FINDINGS: CT HEAD FINDINGS Brain: There is no evidence of acute intracranial hemorrhage, intracranial mass, midline shift or extra-axial fluid collection.No demarcated cortical infarction. A small acute infarct within the right centrum semiovale was better appreciated on same-day brain MRI. Mild chronic small-vessel ischemic disease was also better appreciated on this prior examination. Cerebral volume is normal for age. Vascular: Reported separately. Skull: Normal. Negative for fracture or focal lesion. Sinuses: Mild mucosal thickening within the inferior right maxillary sinus. No significant mastoid effusion Orbits: Visualized orbits demonstrate no acute abnormality. Review of the MIP images confirms the above findings CTA NECK FINDINGS Aortic arch: Standard aortic branching. The visualized aortic arch is unremarkable. Minimal calcified plaque within the distal innominate artery. No significant innominate or proximal subclavian artery stenosis. Right carotid system: CCA and ICA patent within the neck  without stenosis. Minimal mixed plaque within the proximal ICA. Left carotid system: CCA and ICA patent within the neck without stenosis. Minimal ex plaque within the proximal ICA. Vertebral arteries: The right vertebral artery is dominant. The vertebral arteries are patent within the neck bilaterally without stenosis. Skeleton: No acute bony abnormality or aggressive osseous lesion. C6-C7 posterior disc osteophyte complex. Other neck: No neck mass or cervical lymphadenopathy. Thyroid unremarkable. Upper chest: No consolidation within the imaged lung apices. Review of the MIP images confirms the above findings CTA HEAD FINDINGS Anterior circulation: The intracranial internal carotid arteries are patent without significant stenosis. The M1 middle cerebral arteries are patent without significant stenosis. No M2 proximal branch occlusion or high-grade proximal stenosis is identified. The anterior cerebral arteries are patent without high-grade proximal stenosis. No intracranial aneurysm is identified. Posterior circulation: The intracranial vertebral arteries are patent without significant stenosis, as is the basilar artery. The bilateral posterior cerebral arteries are patent without significant proximal stenosis. Posterior communicating arteries are poorly delineated and may be hypoplastic or absent bilaterally. Venous sinuses: Within limitations of contrast timing, no convincing thrombus. Anatomic variants: As described Review of the MIP images confirms the above findings IMPRESSION: CT head: 1. A small acute infarct within the right centrum semiovale was better appreciated on same-day brain MRI. 2. Mild chronic small vessel ischemic disease. CTA neck: The bilateral common carotid, internal carotid and vertebral arteries are patent within the neck without stenosis. Minimal mixed plaque within the proximal ICAs. CTA head: No intracranial large vessel occlusion or proximal high-grade arterial  stenosis. Electronically  Signed   By: Jackey Loge DO   On: 03/01/2020 15:53   CT ANGIO NECK W OR WO CONTRAST  Result Date: 03/01/2020 CLINICAL DATA:  Stroke, follow-up. EXAM: CT ANGIOGRAPHY HEAD AND NECK TECHNIQUE: Multidetector CT imaging of the head and neck was performed using the standard protocol during bolus administration of intravenous contrast. Multiplanar CT image reconstructions and MIPs were obtained to evaluate the vascular anatomy. Carotid stenosis measurements (when applicable) are obtained utilizing NASCET criteria, using the distal internal carotid diameter as the denominator. CONTRAST:  23mL OMNIPAQUE IOHEXOL 350 MG/ML SOLN COMPARISON:  Brain MRI performed earlier the same day 03/01/2020 FINDINGS: CT HEAD FINDINGS Brain: There is no evidence of acute intracranial hemorrhage, intracranial mass, midline shift or extra-axial fluid collection.No demarcated cortical infarction. A small acute infarct within the right centrum semiovale was better appreciated on same-day brain MRI. Mild chronic small-vessel ischemic disease was also better appreciated on this prior examination. Cerebral volume is normal for age. Vascular: Reported separately. Skull: Normal. Negative for fracture or focal lesion. Sinuses: Mild mucosal thickening within the inferior right maxillary sinus. No significant mastoid effusion Orbits: Visualized orbits demonstrate no acute abnormality. Review of the MIP images confirms the above findings CTA NECK FINDINGS Aortic arch: Standard aortic branching. The visualized aortic arch is unremarkable. Minimal calcified plaque within the distal innominate artery. No significant innominate or proximal subclavian artery stenosis. Right carotid system: CCA and ICA patent within the neck without stenosis. Minimal mixed plaque within the proximal ICA. Left carotid system: CCA and ICA patent within the neck without stenosis. Minimal ex plaque within the proximal ICA. Vertebral arteries: The right vertebral artery is  dominant. The vertebral arteries are patent within the neck bilaterally without stenosis. Skeleton: No acute bony abnormality or aggressive osseous lesion. C6-C7 posterior disc osteophyte complex. Other neck: No neck mass or cervical lymphadenopathy. Thyroid unremarkable. Upper chest: No consolidation within the imaged lung apices. Review of the MIP images confirms the above findings CTA HEAD FINDINGS Anterior circulation: The intracranial internal carotid arteries are patent without significant stenosis. The M1 middle cerebral arteries are patent without significant stenosis. No M2 proximal branch occlusion or high-grade proximal stenosis is identified. The anterior cerebral arteries are patent without high-grade proximal stenosis. No intracranial aneurysm is identified. Posterior circulation: The intracranial vertebral arteries are patent without significant stenosis, as is the basilar artery. The bilateral posterior cerebral arteries are patent without significant proximal stenosis. Posterior communicating arteries are poorly delineated and may be hypoplastic or absent bilaterally. Venous sinuses: Within limitations of contrast timing, no convincing thrombus. Anatomic variants: As described Review of the MIP images confirms the above findings IMPRESSION: CT head: 1. A small acute infarct within the right centrum semiovale was better appreciated on same-day brain MRI. 2. Mild chronic small vessel ischemic disease. CTA neck: The bilateral common carotid, internal carotid and vertebral arteries are patent within the neck without stenosis. Minimal mixed plaque within the proximal ICAs. CTA head: No intracranial large vessel occlusion or proximal high-grade arterial stenosis. Electronically Signed   By: Jackey Loge DO   On: 03/01/2020 15:53   MR BRAIN WO CONTRAST  Result Date: 03/01/2020 CLINICAL DATA:  Left-sided facial droop EXAM: MRI HEAD WITHOUT CONTRAST TECHNIQUE: Multiplanar, multiecho pulse sequences of the  brain and surrounding structures were obtained without intravenous contrast. COMPARISON:  None. FINDINGS: Brain: There is a 13 mm area of restricted diffusion within the right centrum semiovale. There is no evidence of intracranial hemorrhage. There is no  intracranial mass, mass effect, or edema. There is no hydrocephalus or extra-axial fluid collection. Patchy T2 hyperintensity in the supratentorial white matter is nonspecific but may reflect mild chronic microvascular ischemic changes. Ventricles and sulci are normal in size and configuration. Vascular: Major vessel flow voids at the skull base are preserved. Skull and upper cervical spine: Normal marrow signal is preserved. Sinuses/Orbits: Trace mucosal thickening.  Orbits are unremarkable. Other: Sella is unremarkable.  Mastoid air cells are clear. IMPRESSION: Acute small vessel infarction involving the right centrum semiovale. Mild chronic microvascular ischemic changes. Electronically Signed   By: Macy Mis M.D.   On: 03/01/2020 13:13   ECHOCARDIOGRAM COMPLETE  Result Date: 03/02/2020    ECHOCARDIOGRAM REPORT   Patient Name:   ISRAEL WUNDER Date of Exam: 03/02/2020 Medical Rec #:  [id number]    Height: Accession #:    [id number]   Weight: Date of Birth:  [birth year]    BSA: Patient Age:    6 years     BP:           167/100 mmHg Patient Gender: F            HR:           76 bpm. Exam Location:  Inpatient Procedure: 2D Echo Indications:    Stroke 434.91 / I163.9  History:        Patient has no prior history of Echocardiogram examinations.                 Risk Factors:Hypertension.  Sonographer:    Darlina Sicilian RDCS Referring Phys: [id number] [place]  1. Normal LV systolic function; grade 1 diastolic dysfunction.  2. Left ventricular ejection fraction, by estimation, is 60 to 65%. The left ventricle has normal function. The left ventricle has no regional wall motion abnormalities. Left ventricular diastolic parameters are consistent with  Grade I diastolic dysfunction (impaired relaxation).  3. Right ventricular systolic function is normal. The right ventricular size is normal. There is normal pulmonary artery systolic pressure.  4. The mitral valve is normal in structure. No evidence of mitral valve regurgitation. No evidence of mitral stenosis.  5. The aortic valve is tricuspid. Aortic valve regurgitation is not visualized. No aortic stenosis is present.  6. The inferior vena cava is normal in size with greater than 50% respiratory variability, suggesting right atrial pressure of 3 mmHg. FINDINGS  Left Ventricle: Left ventricular ejection fraction, by estimation, is 60 to 65%. The left ventricle has normal function. The left ventricle has no regional wall motion abnormalities. The left ventricular internal cavity size was normal in size. There is  no left ventricular hypertrophy. Left ventricular diastolic parameters are consistent with Grade I diastolic dysfunction (impaired relaxation). Right Ventricle: The right ventricular size is normal.Right ventricular systolic function is normal. There is normal pulmonary artery systolic pressure. The tricuspid regurgitant velocity is 2.26 m/s, and with an assumed right atrial pressure of 3 mmHg, the estimated right ventricular systolic pressure is 46.5 mmHg. Left Atrium: Left atrial size was normal in size. Right Atrium: Right atrial size was normal in size. Pericardium: There is no evidence of pericardial effusion. Mitral Valve: The mitral valve is normal in structure. Normal mobility of the mitral valve leaflets. No evidence of mitral valve regurgitation. No evidence of mitral valve stenosis. Tricuspid Valve: The tricuspid valve is normal in structure. Tricuspid valve regurgitation is mild . No evidence of tricuspid stenosis. Aortic Valve: The aortic valve is tricuspid. Aortic valve regurgitation is  not visualized. No aortic stenosis is present. Pulmonic Valve: The pulmonic valve was not well visualized.  Pulmonic valve regurgitation is not visualized. No evidence of pulmonic stenosis. Aorta: The aortic root is normal in size and structure. Venous: The inferior vena cava is normal in size with greater than 50% respiratory variability, suggesting right atrial pressure of 3 mmHg. IAS/Shunts: No atrial level shunt detected by color flow Doppler. Additional Comments: Normal LV systolic function; grade 1 diastolic dysfunction.  LEFT VENTRICLE PLAX 2D LVIDd:         3.84 cm  Diastology LVIDs:         2.51 cm  LV e' lateral:   6.68 cm/s LV PW:         0.96 cm  LV E/e' lateral: 11.5 LV IVS:        0.97 cm  LV e' medial:    5.78 cm/s LVOT diam:     1.80 cm  LV E/e' medial:  13.3 LV SV:         47 LVOT Area:     2.54 cm  RIGHT VENTRICLE RV S prime:     15.30 cm/s TAPSE (M-mode): 2.1 cm LEFT ATRIUM             RIGHT ATRIUM LA diam:        2.70 cm RA Area:     12.95 cm LA Vol (A2C):   38.5 ml RA Volume:   28.90 ml LA Vol (A4C):   36.5 ml LA Biplane Vol: 39.8 ml  AORTIC VALVE LVOT Vmax:   103.00 cm/s LVOT Vmean:  63.500 cm/s LVOT VTI:    0.185 m  AORTA Ao Root diam: 3.20 cm Ao Asc diam:  3.40 cm MITRAL VALVE               TRICUSPID VALVE MV Area (PHT): 4.06 cm    TR Peak grad:   20.4 mmHg MV Decel Time: 187 msec    TR Vmax:        226.00 cm/s MV E velocity: 77.10 cm/s MV A velocity: 64.10 cm/s  SHUNTS MV E/A ratio:  1.20        Systemic VTI:  0.18 m                            Systemic Diam: 1.80 cm Olga MillersBrian Crenshaw MD Electronically signed by Olga MillersBrian Crenshaw MD Signature Date/Time: 03/02/2020/1:00:37 PM    Final      Labs:   Basic Metabolic Panel: Recent Labs  Lab 03/01/20 1104 03/02/20 0948  NA 140 141  K 3.3* 3.4*  CL 107 106  CO2 24 26  GLUCOSE 131* 161*  BUN 12 9  CREATININE 0.59 0.68  CALCIUM 8.7* 9.1   GFR CrCl cannot be calculated (Unknown ideal weight.). Liver Function Tests: No results for input(s): AST, ALT, ALKPHOS, BILITOT, PROT, ALBUMIN in the last 168 hours. No results for input(s): LIPASE,  AMYLASE in the last 168 hours. No results for input(s): AMMONIA in the last 168 hours. Coagulation profile No results for input(s): INR, PROTIME in the last 168 hours.  CBC: Recent Labs  Lab 03/01/20 1104  WBC 6.6  NEUTROABS 5.0  HGB 13.5  HCT 40.4  MCV 90.4  PLT 308   Cardiac Enzymes: No results for input(s): CKTOTAL, CKMB, CKMBINDEX, TROPONINI in the last 168 hours. BNP: Invalid input(s): POCBNP CBG: No results for input(s): GLUCAP in the last 168 hours. D-Dimer No  results for input(s): DDIMER in the last 72 hours. Hgb A1c Recent Labs    03/02/20 0432  HGBA1C 5.0   Lipid Profile Recent Labs    03/02/20 0432  CHOL 192  HDL 66  LDLCALC 109*  TRIG 83  CHOLHDL 2.9   Thyroid function studies No results for input(s): TSH, T4TOTAL, T3FREE, THYROIDAB in the last 72 hours.  Invalid input(s): FREET3 Anemia work up No results for input(s): VITAMINB12, FOLATE, FERRITIN, TIBC, IRON, RETICCTPCT in the last 72 hours. Microbiology Recent Results (from the past 240 hour(s))  SARS CORONAVIRUS 2 (TAT 6-24 HRS) Nasopharyngeal Urine, Random     Status: None   Collection Time: 03/01/20  2:36 PM   Specimen: Urine, Random; Nasopharyngeal  Result Value Ref Range Status   SARS Coronavirus 2 NEGATIVE NEGATIVE Final    Comment: (NOTE) SARS-CoV-2 target nucleic acids are NOT DETECTED. The SARS-CoV-2 RNA is generally detectable in upper and lower respiratory specimens during the acute phase of infection. Negative results do not preclude SARS-CoV-2 infection, do not rule out co-infections with other pathogens, and should not be used as the sole basis for treatment or other patient management decisions. Negative results must be combined with clinical observations, patient history, and epidemiological information. The expected result is Negative. Fact Sheet for Patients: HairSlick.no Fact Sheet for Healthcare  Providers: quierodirigir.com This test is not yet approved or cleared by the [place] FDA and  has been authorized for detection and/or diagnosis of SARS-CoV-2 by FDA under an Emergency Use Authorization (EUA). This EUA will remain  in effect (meaning this test can be used) for the duration of the COVID-19 declaration under Section 56 4(b)(1) of the Act, 21 U.S.C. section 360bbb-3(b)(1), unless the authorization is terminated or revoked sooner. Performed at [facility], [address] [place], [place] [address]      Discharge Instructions:   Discharge Instructions    Ambulatory referral to Neurology   Complete by: As directed    An appointment is requested in approximately 4 weeks post stroke follow up   Call MD for:  extreme fatigue   Complete by: As directed    Call MD for:  persistant dizziness or light-headedness   Complete by: As directed    Call MD for:  severe uncontrolled pain   Complete by: As directed    Call MD for:  temperature >100.4   Complete by: As directed    Diet - low sodium heart healthy   Complete by: As directed    Discharge instructions   Complete by: As directed    Take medication as prescribed Follow-up with neurology in 4 to 6 weeks as instructed Follow-up with primary care provider in 1 to 2 weeks for evaluation of blood pressure control,potassium level and urinalysis   Increase activity slowly   Complete by: As directed      Allergies as of 03/02/2020      Reactions   Demerol [meperidine] Rash      Medication List    TAKE these medications   aspirin 81 MG EC tablet Take 1 tablet (81 mg total) by mouth daily. Start taking on: March 03, 2020   atorvastatin 80 MG tablet Commonly known as: LIPITOR Take 1 tablet (80 mg total) by mouth daily at 6 PM.   clopidogrel 75 MG tablet Commonly known as: PLAVIX Take 1 tablet (75 mg total) by mouth daily for 21 days. Start taking on: March 03, 2020   labetalol  100 MG tablet  Commonly known as: NORMODYNE Take 100 mg by mouth 2 (two) times daily.   NASAL SPRAY NA Place 1 spray into the nose daily as needed (allergies).         Time coordinating discharge: 45 minutes  Signed:  Gwenyth Bender NP  [facility] 03/02/2020, 2:36 PM

## 2020-03-02 NOTE — TOC Transition Note (Signed)
Transition of Care [facility]) - CM/SW Discharge Note   Patient Details  Name: Melissa Poole MRN: [id number] Date of Birth: December 07, 1971  [facility] of Care [facility]) CM/SW Contact:  Kermit Balo, RN Phone Number: 03/02/2020, 3:57 PM   Clinical Narrative:    Pt discharged home with self care. No f/u per PT and no DME needs. Pt has hospital f/u and transportation home.    Final next level of care: Home/Self Care Barriers to Discharge: No Barriers Identified   Patient Goals and CMS Choice        Discharge Placement                       Discharge Plan and Services                                     Social Determinants of Health (SDOH) Interventions     Readmission Risk Interventions No flowsheet data found.

## 2020-03-02 NOTE — Progress Notes (Signed)
STROKE TEAM PROGRESS NOTE   SUBJECTIVE (INTERVAL HISTORY) Her husband is at the bedside.  Overall her condition is rapidly improving. She is lying in bed, awake alert orientated, still has mild left facial droop and slight dysarthria but denies any arm or leg weakness. Denies smoking.    OBJECTIVE Temp:  [98.1 F (36.7 C)-98.9 F (37.2 C)] 98.1 F (36.7 C) (03/08 1136) Pulse Rate:  [69-77] 71 (03/08 1136) Cardiac Rhythm: Normal sinus rhythm (03/08 0701) Resp:  [14-18] 18 (03/08 1136) BP: (135-167)/(93-110) 164/94 (03/08 1136) SpO2:  [95 %-99 %] 99 % (03/08 1308)  No results for input(s): GLUCAP in the last 168 hours. Recent Labs  Lab 03/01/20 1104 03/02/20 0948  NA 140 141  K 3.3* 3.4*  CL 107 106  CO2 24 26  GLUCOSE 131* 161*  BUN 12 9  CREATININE 0.59 0.68  CALCIUM 8.7* 9.1   No results for input(s): AST, ALT, ALKPHOS, BILITOT, PROT, ALBUMIN in the last 168 hours. Recent Labs  Lab 03/01/20 1104  WBC 6.6  NEUTROABS 5.0  HGB 13.5  HCT 40.4  MCV 90.4  PLT 308   No results for input(s): CKTOTAL, CKMB, CKMBINDEX, TROPONINI in the last 168 hours. No results for input(s): LABPROT, INR in the last 72 hours. Recent Labs    03/01/20 1436  COLORURINE YELLOW  LABSPEC 1.006  PHURINE 7.0  GLUCOSEU NEGATIVE  HGBUR NEGATIVE  BILIRUBINUR NEGATIVE  KETONESUR NEGATIVE  PROTEINUR NEGATIVE  NITRITE POSITIVE*  LEUKOCYTESUR NEGATIVE       Component Value Date/Time   CHOL 192 03/02/2020 0432   TRIG 83 03/02/2020 0432   HDL 66 03/02/2020 0432   CHOLHDL 2.9 03/02/2020 0432   VLDL 17 03/02/2020 0432   LDLCALC 109 (H) 03/02/2020 0432   Lab Results  Component Value Date   HGBA1C 5.0 03/02/2020      Component Value Date/Time   LABOPIA NONE DETECTED 03/01/2020 1436   COCAINSCRNUR NONE DETECTED 03/01/2020 1436   LABBENZ NONE DETECTED 03/01/2020 1436   AMPHETMU NONE DETECTED 03/01/2020 1436   THCU NONE DETECTED 03/01/2020 1436   LABBARB NONE DETECTED 03/01/2020 1436     No results for input(s): ETH in the last 168 hours.  I have personally reviewed the radiological images below and agree with the radiology interpretations.  CT ANGIO HEAD W OR WO CONTRAST  Result Date: 03/01/2020 CLINICAL DATA:  Stroke, follow-up. EXAM: CT ANGIOGRAPHY HEAD AND NECK TECHNIQUE: Multidetector CT imaging of the head and neck was performed using the standard protocol during bolus administration of intravenous contrast. Multiplanar CT image reconstructions and MIPs were obtained to evaluate the vascular anatomy. Carotid stenosis measurements (when applicable) are obtained utilizing NASCET criteria, using the distal internal carotid diameter as the denominator. CONTRAST:  33mL OMNIPAQUE IOHEXOL 350 MG/ML SOLN COMPARISON:  Brain MRI performed earlier the same day 03/01/2020 FINDINGS: CT HEAD FINDINGS Brain: There is no evidence of acute intracranial hemorrhage, intracranial mass, midline shift or extra-axial fluid collection.No demarcated cortical infarction. A small acute infarct within the right centrum semiovale was better appreciated on same-day brain MRI. Mild chronic small-vessel ischemic disease was also better appreciated on this prior examination. Cerebral volume is normal for age. Vascular: Reported separately. Skull: Normal. Negative for fracture or focal lesion. Sinuses: Mild mucosal thickening within the inferior right maxillary sinus. No significant mastoid effusion Orbits: Visualized orbits demonstrate no acute abnormality. Review of the MIP images confirms the above findings CTA NECK FINDINGS Aortic arch: Standard aortic branching. The visualized aortic arch  is unremarkable. Minimal calcified plaque within the distal innominate artery. No significant innominate or proximal subclavian artery stenosis. Right carotid system: CCA and ICA patent within the neck without stenosis. Minimal mixed plaque within the proximal ICA. Left carotid system: CCA and ICA patent within the neck without  stenosis. Minimal ex plaque within the proximal ICA. Vertebral arteries: The right vertebral artery is dominant. The vertebral arteries are patent within the neck bilaterally without stenosis. Skeleton: No acute bony abnormality or aggressive osseous lesion. C6-C7 posterior disc osteophyte complex. Other neck: No neck mass or cervical lymphadenopathy. Thyroid unremarkable. Upper chest: No consolidation within the imaged lung apices. Review of the MIP images confirms the above findings CTA HEAD FINDINGS Anterior circulation: The intracranial internal carotid arteries are patent without significant stenosis. The M1 middle cerebral arteries are patent without significant stenosis. No M2 proximal branch occlusion or high-grade proximal stenosis is identified. The anterior cerebral arteries are patent without high-grade proximal stenosis. No intracranial aneurysm is identified. Posterior circulation: The intracranial vertebral arteries are patent without significant stenosis, as is the basilar artery. The bilateral posterior cerebral arteries are patent without significant proximal stenosis. Posterior communicating arteries are poorly delineated and may be hypoplastic or absent bilaterally. Venous sinuses: Within limitations of contrast timing, no convincing thrombus. Anatomic variants: As described Review of the MIP images confirms the above findings IMPRESSION: CT head: 1. A small acute infarct within the right centrum semiovale was better appreciated on same-day brain MRI. 2. Mild chronic small vessel ischemic disease. CTA neck: The bilateral common carotid, internal carotid and vertebral arteries are patent within the neck without stenosis. Minimal mixed plaque within the proximal ICAs. CTA head: No intracranial large vessel occlusion or proximal high-grade arterial stenosis. Electronically Signed   By: Jackey Loge DO   On: 03/01/2020 15:53   CT ANGIO NECK W OR WO CONTRAST  Result Date: 03/01/2020 CLINICAL DATA:   Stroke, follow-up. EXAM: CT ANGIOGRAPHY HEAD AND NECK TECHNIQUE: Multidetector CT imaging of the head and neck was performed using the standard protocol during bolus administration of intravenous contrast. Multiplanar CT image reconstructions and MIPs were obtained to evaluate the vascular anatomy. Carotid stenosis measurements (when applicable) are obtained utilizing NASCET criteria, using the distal internal carotid diameter as the denominator. CONTRAST:  50mL OMNIPAQUE IOHEXOL 350 MG/ML SOLN COMPARISON:  Brain MRI performed earlier the same day 03/01/2020 FINDINGS: CT HEAD FINDINGS Brain: There is no evidence of acute intracranial hemorrhage, intracranial mass, midline shift or extra-axial fluid collection.No demarcated cortical infarction. A small acute infarct within the right centrum semiovale was better appreciated on same-day brain MRI. Mild chronic small-vessel ischemic disease was also better appreciated on this prior examination. Cerebral volume is normal for age. Vascular: Reported separately. Skull: Normal. Negative for fracture or focal lesion. Sinuses: Mild mucosal thickening within the inferior right maxillary sinus. No significant mastoid effusion Orbits: Visualized orbits demonstrate no acute abnormality. Review of the MIP images confirms the above findings CTA NECK FINDINGS Aortic arch: Standard aortic branching. The visualized aortic arch is unremarkable. Minimal calcified plaque within the distal innominate artery. No significant innominate or proximal subclavian artery stenosis. Right carotid system: CCA and ICA patent within the neck without stenosis. Minimal mixed plaque within the proximal ICA. Left carotid system: CCA and ICA patent within the neck without stenosis. Minimal ex plaque within the proximal ICA. Vertebral arteries: The right vertebral artery is dominant. The vertebral arteries are patent within the neck bilaterally without stenosis. Skeleton: No acute bony abnormality or  aggressive osseous lesion. C6-C7 posterior disc osteophyte complex. Other neck: No neck mass or cervical lymphadenopathy. Thyroid unremarkable. Upper chest: No consolidation within the imaged lung apices. Review of the MIP images confirms the above findings CTA HEAD FINDINGS Anterior circulation: The intracranial internal carotid arteries are patent without significant stenosis. The M1 middle cerebral arteries are patent without significant stenosis. No M2 proximal branch occlusion or high-grade proximal stenosis is identified. The anterior cerebral arteries are patent without high-grade proximal stenosis. No intracranial aneurysm is identified. Posterior circulation: The intracranial vertebral arteries are patent without significant stenosis, as is the basilar artery. The bilateral posterior cerebral arteries are patent without significant proximal stenosis. Posterior communicating arteries are poorly delineated and may be hypoplastic or absent bilaterally. Venous sinuses: Within limitations of contrast timing, no convincing thrombus. Anatomic variants: As described Review of the MIP images confirms the above findings IMPRESSION: CT head: 1. A small acute infarct within the right centrum semiovale was better appreciated on same-day brain MRI. 2. Mild chronic small vessel ischemic disease. CTA neck: The bilateral common carotid, internal carotid and vertebral arteries are patent within the neck without stenosis. Minimal mixed plaque within the proximal ICAs. CTA head: No intracranial large vessel occlusion or proximal high-grade arterial stenosis. Electronically Signed   By: Jackey Loge DO   On: 03/01/2020 15:53   MR BRAIN WO CONTRAST  Result Date: 03/01/2020 CLINICAL DATA:  Left-sided facial droop EXAM: MRI HEAD WITHOUT CONTRAST TECHNIQUE: Multiplanar, multiecho pulse sequences of the brain and surrounding structures were obtained without intravenous contrast. COMPARISON:  None. FINDINGS: Brain: There is a 13  mm area of restricted diffusion within the right centrum semiovale. There is no evidence of intracranial hemorrhage. There is no intracranial mass, mass effect, or edema. There is no hydrocephalus or extra-axial fluid collection. Patchy T2 hyperintensity in the supratentorial white matter is nonspecific but may reflect mild chronic microvascular ischemic changes. Ventricles and sulci are normal in size and configuration. Vascular: Major vessel flow voids at the skull base are preserved. Skull and upper cervical spine: Normal marrow signal is preserved. Sinuses/Orbits: Trace mucosal thickening.  Orbits are unremarkable. Other: Sella is unremarkable.  Mastoid air cells are clear. IMPRESSION: Acute small vessel infarction involving the right centrum semiovale. Mild chronic microvascular ischemic changes. Electronically Signed   By: Guadlupe Spanish M.D.   On: 03/01/2020 13:13   ECHOCARDIOGRAM COMPLETE  Result Date: 03/02/2020    ECHOCARDIOGRAM REPORT   Patient Name:   GREGG HOLSTER Date of Exam: 03/02/2020 Medical Rec #:  [id number]    Height: Accession #:    [id number]   Weight: Date of Birth:  October 25, 1971    BSA: Patient Age:    49 years     BP:           167/100 mmHg Patient Gender: F            HR:           76 bpm. Exam Location:  Inpatient Procedure: 2D Echo Indications:    Stroke 434.91 / I163.9  History:        Patient has no prior history of Echocardiogram examinations.                 Risk Factors:Hypertension.  Sonographer:    Leta Jungling RDCS Referring Phys: [id number] Kasandra Knudsen PAHWANI IMPRESSIONS  1. Normal LV systolic function; grade 1 diastolic dysfunction.  2. Left ventricular ejection fraction, by estimation, is 60 to 65%. The left ventricle has normal function.  The left ventricle has no regional wall motion abnormalities. Left ventricular diastolic parameters are consistent with Grade I diastolic dysfunction (impaired relaxation).  3. Right ventricular systolic function is normal. The right ventricular size  is normal. There is normal pulmonary artery systolic pressure.  4. The mitral valve is normal in structure. No evidence of mitral valve regurgitation. No evidence of mitral stenosis.  5. The aortic valve is tricuspid. Aortic valve regurgitation is not visualized. No aortic stenosis is present.  6. The inferior vena cava is normal in size with greater than 50% respiratory variability, suggesting right atrial pressure of 3 mmHg. FINDINGS  Left Ventricle: Left ventricular ejection fraction, by estimation, is 60 to 65%. The left ventricle has normal function. The left ventricle has no regional wall motion abnormalities. The left ventricular internal cavity size was normal in size. There is  no left ventricular hypertrophy. Left ventricular diastolic parameters are consistent with Grade I diastolic dysfunction (impaired relaxation). Right Ventricle: The right ventricular size is normal.Right ventricular systolic function is normal. There is normal pulmonary artery systolic pressure. The tricuspid regurgitant velocity is 2.26 m/s, and with an assumed right atrial pressure of 3 mmHg, the estimated right ventricular systolic pressure is 35.3 mmHg. Left Atrium: Left atrial size was normal in size. Right Atrium: Right atrial size was normal in size. Pericardium: There is no evidence of pericardial effusion. Mitral Valve: The mitral valve is normal in structure. Normal mobility of the mitral valve leaflets. No evidence of mitral valve regurgitation. No evidence of mitral valve stenosis. Tricuspid Valve: The tricuspid valve is normal in structure. Tricuspid valve regurgitation is mild . No evidence of tricuspid stenosis. Aortic Valve: The aortic valve is tricuspid. Aortic valve regurgitation is not visualized. No aortic stenosis is present. Pulmonic Valve: The pulmonic valve was not well visualized. Pulmonic valve regurgitation is not visualized. No evidence of pulmonic stenosis. Aorta: The aortic root is normal in size and  structure. Venous: The inferior vena cava is normal in size with greater than 50% respiratory variability, suggesting right atrial pressure of 3 mmHg. IAS/Shunts: No atrial level shunt detected by color flow Doppler. Additional Comments: Normal LV systolic function; grade 1 diastolic dysfunction.  LEFT VENTRICLE PLAX 2D LVIDd:         3.84 cm  Diastology LVIDs:         2.51 cm  LV e' lateral:   6.68 cm/s LV PW:         0.96 cm  LV E/e' lateral: 11.5 LV IVS:        0.97 cm  LV e' medial:    5.78 cm/s LVOT diam:     1.80 cm  LV E/e' medial:  13.3 LV SV:         47 LVOT Area:     2.54 cm  RIGHT VENTRICLE RV S prime:     15.30 cm/s TAPSE (M-mode): 2.1 cm LEFT ATRIUM             RIGHT ATRIUM LA diam:        2.70 cm RA Area:     12.95 cm LA Vol (A2C):   38.5 ml RA Volume:   28.90 ml LA Vol (A4C):   36.5 ml LA Biplane Vol: 39.8 ml  AORTIC VALVE LVOT Vmax:   103.00 cm/s LVOT Vmean:  63.500 cm/s LVOT VTI:    0.185 m  AORTA Ao Root diam: 3.20 cm Ao Asc diam:  3.40 cm MITRAL VALVE  TRICUSPID VALVE MV Area (PHT): 4.06 cm    TR Peak grad:   20.4 mmHg MV Decel Time: 187 msec    TR Vmax:        226.00 cm/s MV E velocity: 77.10 cm/s MV A velocity: 64.10 cm/s  SHUNTS MV E/A ratio:  1.20        Systemic VTI:  0.18 m                            Systemic Diam: 1.80 cm Olga MillersBrian Crenshaw MD Electronically signed by Olga MillersBrian Crenshaw MD Signature Date/Time: 03/02/2020/1:00:37 PM    Final     PHYSICAL EXAM  Temp:  [98.1 F (36.7 C)-98.9 F (37.2 C)] 98.1 F (36.7 C) (03/08 1136) Pulse Rate:  [69-77] 71 (03/08 1136) Resp:  [14-18] 18 (03/08 1136) BP: (135-167)/(93-110) 164/94 (03/08 1136) SpO2:  [95 %-99 %] 99 % (03/08 1308)  General - Well nourished, well developed, in no apparent distress.  Ophthalmologic - fundi not visualized due to noncooperation.  Cardiovascular - Regular rhythm and rate.  Mental Status -  Level of arousal and orientation to time, place, and person were intact. Language including  expression, naming, repetition, comprehension was assessed and found intact. Attention span and concentration were normal. Fund of Knowledge was assessed and was intact.  Cranial Nerves II - XII - II - Visual field intact OU. III, IV, VI - Extraocular movements intact. V - Facial sensation intact bilaterally. VII - mild left facial droop. VIII - Hearing & vestibular intact bilaterally. X - Palate elevates symmetrically, slight dysarthria. XI - Chin turning & shoulder shrug intact bilaterally. XII - Tongue protrusion intact.  Motor Strength - The patient's strength was normal in all extremities and pronator drift was absent.  Bulk was normal and fasciculations were absent.   Motor Tone - Muscle tone was assessed at the neck and appendages and was normal.  Reflexes - The patient's reflexes were symmetrical in all extremities and she had no pathological reflexes.  Sensory - Light touch, temperature/pinprick were assessed and were symmetrical.    Coordination - The patient had normal movements in the hands and feet with no ataxia or dysmetria.  Tremor was absent.  Gait and Station - deferred.   ASSESSMENT/PLAN Ms. Illene SilverGenine Faerber is a 49 y.o. female with history of HTN admitted for left facial droop and numbness. No tPA given due to outside window.    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 Recommend DAPT for 3 weeks and then ASA alone  Patient counseled to be compliant with her antithrombotic medications  Ongoing aggressive stroke risk factor management  Therapy recommendations:  none  Disposition:  home  Hypertension . Stable . Permissive hypertension (OK  if <220/120) for 24-48 hours post stroke and then gradually normalized within 3-5 days.  Long term BP goal normotensive  Hyperlipidemia  Home meds:  none   LDL 109, goal < 70  Now on lipitor 80  Continue statin at discharge  Other Stroke Risk Factors    Other Active Problems  Hypokalemia K 3.3 -> 3.4 - supplement  Hospital day #  0  Neurology will sign off. Please call with questions. Pt will follow up with stroke clinic NP at [facility] in about 4 weeks. Thanks for the consult.  Marvel Plan, MD PhD Stroke Neurology 03/02/2020 2:33 PM    To contact Stroke Continuity provider, please refer to [email or web address]. After hours, contact General Neurology

## 2020-04-02 ENCOUNTER — Encounter: Payer: Self-pay | Admitting: *Deleted

## 2020-04-02 ENCOUNTER — Other Ambulatory Visit: Payer: Self-pay

## 2020-04-02 ENCOUNTER — Ambulatory Visit: Payer: Managed Care, Other (non HMO) | Admitting: Adult Health

## 2020-04-02 ENCOUNTER — Encounter: Payer: Self-pay | Admitting: Adult Health

## 2020-04-02 VITALS — BP 128/82 | HR 75 | Temp 97.4°F | Ht 66.0 in | Wt 140.0 lb

## 2020-04-02 DIAGNOSIS — I10 Essential (primary) hypertension: Secondary | ICD-10-CM

## 2020-04-02 DIAGNOSIS — E785 Hyperlipidemia, unspecified: Secondary | ICD-10-CM

## 2020-04-02 DIAGNOSIS — I639 Cerebral infarction, unspecified: Secondary | ICD-10-CM | POA: Diagnosis not present

## 2020-04-02 NOTE — Progress Notes (Signed)
[facility] [address] [place]. [place] [address] [phone]       HOSPITAL FOLLOW UP NOTE  [name] Date of Birth:  01-Jan-1971 Medical Record Number:  [id number]   Reason for Referral:  hospital stroke follow up    CHIEF COMPLAINT:  Chief Complaint  Patient presents with  . Follow-up    trearment rm, alone, hospital fu    HPI:   [name] is a 49 y.o. female with history of HTN  presented on 03/01/2020 for left facial droop and numbness.  Evaluated by stroke team and Dr. Erlinda Hong with stroke work-up revealing right CR/SO infarct secondary to small vessel disease symptoms with resultant mild left facial droop and slight dysarthria.  CTA head/neck and 2D echo unremarkable.  Initiated DAPT for 3 weeks and aspirin alone.  HTN stable.  LDL 109 and initiated atorvastatin 80 mg daily.  No history or evidence of DM with A1c 5.0.  No prior history of stroke.  She was discharged home in stable condition without therapy needs.  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 Recommend DAPT for 3 weeks and then ASA alone  Patient counseled to be compliant with her antithrombotic medications  Ongoing aggressive stroke risk factor management  Therapy recommendations:  none  Disposition:  home   Today, 04/02/2020, [name] is being seen for hospital follow-up. She endorses improvement of dysarthria and facial weakness. She does endorse occasional difficulty with speech usually with speaking too quickly.  She has returned back to all prior activities without difficulty.  Completed 3 weeks DAPT and continues on aspirin alone with bleeding or bruising.  Continues on lipitor without myalgias.  Plans on following up with PCP in the near future for repeat lab work.  She also endorses dietary changes. Blood pressure 128/82. Monitors at home and typically range 100-120s/60-80s. No concerns at this time.      ROS:   14 system review of systems performed and negative with exception of occasional speech difficulty  PMH:  Past Medical History:  Diagnosis Date  . Hypertension   . Stroke [facility])     PSH: History reviewed. No pertinent surgical history.  Social History:  Social History   Socioeconomic History  . Marital status: Married    Spouse name: Not on file  . Number of children: Not on file  . Years of education: Not on file  . Highest education level: Not on file  Occupational History  . Not on file  Tobacco Use  . Smoking status: Never Smoker  . Smokeless tobacco: Never Used  Substance and Sexual Activity  . Alcohol use: Not on file  . Drug use: Not on file  . Sexual activity: Not on file  Other Topics Concern  . Not on file  Social History Narrative  . Not on file   Social Determinants of Health   Financial Resource Strain:   . Difficulty of Paying Living Expenses:   Food Insecurity:   . Worried About Charity fundraiser in the Last Year:   . Arboriculturist in the Last Year:   Transportation Needs:   .  Lack of Transportation (Medical):   Marland Kitchen Lack of Transportation (Non-Medical):   Physical Activity:   . Days of Exercise per Week:   . Minutes of Exercise per Session:   Stress:   . Feeling of Stress :   Social Connections:   . Frequency of Communication with Friends and Family:   . Frequency of Social Gatherings with Friends and Family:   . Attends Religious Services:   . Active Member of Clubs or Organizations:   . Attends Banker Meetings:   Marland Kitchen Marital Status:   Intimate Partner Violence:   . Fear of Current or Ex-Partner:   . Emotionally Abused:   Marland Kitchen Physically Abused:   . Sexually Abused:      Family History: History reviewed. No pertinent family history.  Medications:   Current Outpatient Medications on File Prior to Visit  Medication Sig Dispense Refill  . aspirin EC 81 MG EC tablet Take 1 tablet (81 mg total) by mouth daily.    Marland Kitchen atorvastatin (LIPITOR) 80 MG tablet Take 1 tablet (80 mg total) by mouth daily at 6 PM. 30 tablet 0  . Glucosamine-Chondroit-Vit C-Mn (GLUCOSAMINE 1500 COMPLEX) CAPS Take by mouth.    . labetalol (NORMODYNE) 100 MG tablet Take 100 mg by mouth 2 (two) times daily.    . Multiple Vitamins-Minerals (MULTI-DAY PLUS MINERALS PO) Take by mouth.    . Omega-3 Fatty Acids (FISH OIL) 1000 MG CAPS Take by mouth.    . Oxymetazoline HCl (NASAL SPRAY NA) Place 1 spray into the nose daily as needed (allergies).    . Turmeric (QC TUMERIC COMPLEX PO) Take by mouth.    . Vitamin D, Cholecalciferol, 50 MCG (2000 UT) CAPS Take by mouth.     No current facility-administered medications on file prior to visit.    Allergies:   Allergies  Allergen Reactions  . Demerol [Meperidine] Rash     Physical Exam  Vitals:   04/02/20 0805  BP: 128/82  Pulse: 75  Temp: (!) 97.4 F (36.3 C)  Weight: 140 lb (63.5 kg)  Height: 5\' 6"  (1.676 m)   Body mass index is 22.6 kg/m. No exam data present  Depression screen [facility] 2/9 04/02/2020  Decreased Interest 0  Down, Depressed, Hopeless 0  PHQ - 2 Score 0     General: well developed, well nourished,  pleasant middle-age Caucasian female, seated, in no evident distress Head: head normocephalic and atraumatic.   Neck: supple with no carotid or supraclavicular bruits Cardiovascular: regular rate and rhythm, no murmurs Musculoskeletal: no deformity Skin:  no rash/petichiae Vascular:  Normal pulses all extremities   Neurologic Exam Mental Status: Awake and fully alert.   Normal speech and language.  Patient endorses slightly slurred speech with speaking quickly.  Oriented to place and time. Recent and remote memory  intact. Attention span, concentration and fund of knowledge appropriate. Mood and affect appropriate.  Cranial Nerves: Fundoscopic exam reveals sharp disc margins. Pupils equal, briskly reactive to light. Extraocular movements full without nystagmus. Visual fields full to confrontation. Hearing intact. Facial sensation intact.  Very slight left lower facial weakness. Motor: Normal bulk and tone. Normal strength in all tested extremity muscles. Sensory.: intact to touch , pinprick , position and vibratory sensation.  Coordination: Rapid alternating movements normal in all extremities. Finger-to-nose and heel-to-shin performed accurately bilaterally. Gait and Station: Arises from chair without difficulty. Stance is normal. Gait demonstrates normal stride length and balance Reflexes: 1+ and symmetric. Toes downgoing.  NIHSS  1 Modified Rankin  1     ASSESSMENT: Callyn Severtson is a 49 y.o. year old female presented with left facial droop and numbness on 03/01/2020 with stroke work-up revealing right CR/SO infarct secondary to small vessel disease. Vascular risk factors include HTN and HLD.  Recovered well from a stroke standpoint with very slight left lower facial weakness and occasional dysarthria    PLAN:  1. Right CR/SO infarct:  -Residual deficits currently improving and likely will continue to improve ongoing.  No indication for therapy needs at this time. -Continue aspirin 81 mg daily  and atorvastatin 80 mg daily for secondary stroke prevention. -Maintain strict control of hypertension with blood pressure goal below 130/90, diabetes with hemoglobin A1c goal below 6.5% and cholesterol with LDL cholesterol (bad cholesterol) goal below 70 mg/dL.  I also advised the patient to eat a healthy diet with plenty of whole grains, cereals, fruits and vegetables, exercise regularly with at least 30 minutes of continuous activity daily and maintain ideal body weight. 2. HTN: Reviewed home readings  which have been stable.  Continue to follow with PCP for monitoring management 3. HLD: Continuation of atorvastatin 80 mg daily.  Follow-up with PCP for repeat lab work in the near future.  Advise lifelong use of statin for secondary stroke prevention but may decrease dosage in the future based on LDL levels if needed    Follow up in 4 months or call earlier if needed   I spent 42 minutes of face-to-face and non-face-to-face time with patient.  This included previsit chart review, lab review, study review, order entry, electronic health record documentation, patient education regarding recent stroke, importance of stroke risk factor management and answered all questions to patient satisfaction    Ihor Austin, [facility]  [facility] [address] [place], [place] [address]  Phone [phone] Fax [phone] Note: This document was prepared with digital dictation and possible smart phrase technology. Any transcriptional errors that result from this process are unintentional.

## 2020-04-02 NOTE — Patient Instructions (Signed)
Continue aspirin 81 mg daily  and lipitor 80mg  daily  for secondary stroke prevention  Continue to follow up with PCP regarding cholesterol and blood pressure management   Continue to monitor blood pressure at home  Maintain strict control of hypertension with blood pressure goal below 130/90, diabetes with hemoglobin A1c goal below 6.5% and cholesterol with LDL cholesterol (bad cholesterol) goal below 70 mg/dL. I also advised the patient to eat a healthy diet with plenty of whole grains, cereals, fruits and vegetables, exercise regularly and maintain ideal body weight.  Followup in the future with me in 4 months or call earlier if needed       Thank you for coming to see at [facility]. I hope we have been able to provide you high quality care today.  You may receive a patient satisfaction survey over the next few weeks. We would appreciate your feedback and comments so that we may continue to improve ourselves and the health of our patients.

## 2020-04-02 NOTE — Progress Notes (Signed)
I agree with the above plan 

## 2020-04-07 ENCOUNTER — Telehealth: Payer: Self-pay | Admitting: Adult Health

## 2020-04-07 NOTE — Telephone Encounter (Signed)
1) Medication(s) Requested (by name): atorvastatin (LIPITOR) 80 MG tablet   2) Pharmacy of Choice: [id number] [place], [place] - [address]  [address], [place] [address]

## 2020-08-03 ENCOUNTER — Ambulatory Visit: Payer: Managed Care, Other (non HMO) | Admitting: Adult Health

## 2020-08-03 ENCOUNTER — Encounter: Payer: Self-pay | Admitting: Adult Health

## 2020-08-03 VITALS — BP 108/71 | HR 78 | Ht 66.0 in | Wt 140.4 lb

## 2020-08-03 DIAGNOSIS — I639 Cerebral infarction, unspecified: Secondary | ICD-10-CM | POA: Diagnosis not present

## 2020-08-03 DIAGNOSIS — I10 Essential (primary) hypertension: Secondary | ICD-10-CM

## 2020-08-03 DIAGNOSIS — E785 Hyperlipidemia, unspecified: Secondary | ICD-10-CM

## 2020-08-03 NOTE — Progress Notes (Signed)
[facility] [address] [place]. [place] [address] [phone]       STROKE FOLLOW UP NOTE  [name] Date of Birth:  [birth year] Medical Record Number:  [id number]   Reason for Referral: stroke follow up    CHIEF COMPLAINT:  Chief Complaint  Patient presents with  . Follow-up    4 month f/u for stroke, states she has been doing well since last visit.   Marland Kitchen room 9    alone    HPI:   Today, 08/03/2020, [name] returns for stroke follow-up.  Stable since prior visit without new or worsening stroke/TIA symptoms.  Reports complete recovery without residual deficits  Remains on aspirin 81 mg daily and atorvastatin 80 mg daily without side effects.  Blood pressure today 108/71.  Monitors at home and has been stable. Continues to follow with PCP for management of HTN and HLD. Reports recent LDL 58 completed by PCP in June (unable to personally view via epic).     History provided for reference purposes only Initial visit 04/02/2020 JM: [name] is being seen for hospital follow-up. She endorses improvement of dysarthria and facial weakness. She does endorse occasional difficulty with speech usually with speaking too quickly.  She has returned back to all prior activities without difficulty.  Completed 3 weeks DAPT and continues on aspirin alone with bleeding or bruising. Continues on lipitor without myalgias.  Plans on following up with PCP in the near future for repeat lab work.  She also endorses dietary changes. Blood pressure 128/82. Monitors at home and typically range 100-120s/60-80s. No concerns at this time.   Stroke admission 03/01/2020: [name] is a 49 y.o. female with history of HTN  presented on 03/01/2020 for left facial droop and numbness.  Evaluated by stroke team and Dr. Roda Shutters with stroke work-up revealing right CR/SO infarct secondary to small vessel disease symptoms with resultant mild left facial droop and slight dysarthria.  CTA head/neck  and 2D echo unremarkable.  Initiated DAPT for 3 weeks and aspirin alone.  HTN stable.  LDL 109 and initiated atorvastatin 80 mg daily.  No history or evidence of DM with A1c 5.0.  No prior history of stroke.  She was discharged home in stable condition without therapy needs.  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 Recommend DAPT for 3 weeks and then ASA alone  Patient counseled to be compliant with her antithrombotic medications  Ongoing aggressive stroke risk factor management  Therapy recommendations:  none  Disposition:  home      ROS:   14 system review of systems performed and negative with exception of no complaints  PMH:  Past Medical History:  Diagnosis Date  . Hypertension   . Stroke [facility])     PSH: No past surgical history on file.  Social History:  Social History   Socioeconomic History  . Marital status: Married    Spouse name: Not on file  . Number of children: Not on file  . Years of education: Not on file  . Highest education level: Not on file  Occupational History  . Not on file  Tobacco Use  .  Smoking status: Never Smoker  . Smokeless tobacco: Never Used  Substance and Sexual Activity  . Alcohol use: Not on file  . Drug use: Not on file  . Sexual activity: Not on file  Other Topics Concern  . Not on file  Social History Narrative  . Not on file   Social Determinants of Health   Financial Resource Strain:   . Difficulty of Paying Living Expenses:   Food Insecurity:   . Worried About Programme researcher, broadcasting/film/video in the Last Year:   . Barista in the Last Year:   Transportation Needs:   . Freight forwarder (Medical):    Marland Kitchen Lack of Transportation (Non-Medical):   Physical Activity:   . Days of Exercise per Week:   . Minutes of Exercise per Session:   Stress:   . Feeling of Stress :   Social Connections:   . Frequency of Communication with Friends and Family:   . Frequency of Social Gatherings with Friends and Family:   . Attends Religious Services:   . Active Member of Clubs or Organizations:   . Attends Banker Meetings:   Marland Kitchen Marital Status:   Intimate Partner Violence:   . Fear of Current or Ex-Partner:   . Emotionally Abused:   Marland Kitchen Physically Abused:   . Sexually Abused:     Family History: No family history on file.  Medications:   Current Outpatient Medications on File Prior to Visit  Medication Sig Dispense Refill  . aspirin EC 81 MG EC tablet Take 1 tablet (81 mg total) by mouth daily.    Marland Kitchen atorvastatin (LIPITOR) 80 MG tablet Take 1 tablet (80 mg total) by mouth daily at 6 PM. 30 tablet 0  . Glucosamine-Chondroit-Vit C-Mn (GLUCOSAMINE 1500 COMPLEX) CAPS Take by mouth.    . labetalol (NORMODYNE) 100 MG tablet Take 100 mg by mouth 2 (two) times daily.    . Multiple Vitamins-Minerals (MULTI-DAY PLUS MINERALS PO) Take by mouth.    . Omega-3 Fatty Acids (FISH OIL) 1000 MG CAPS Take by mouth.    . Oxymetazoline HCl (NASAL SPRAY NA) Place 1 spray into the nose daily as needed (allergies).    . Turmeric (QC TUMERIC COMPLEX PO) Take by mouth.    . Vitamin D, Cholecalciferol, 50 MCG (2000 UT) CAPS Take by mouth.     No current facility-administered medications on file prior to visit.    Allergies:   Allergies  Allergen Reactions  . Demerol [Meperidine] Rash     Physical Exam  Vitals:   08/03/20 0734  BP: 108/71  Pulse: 78  Weight: 140 lb 6.4 oz (63.7 kg)  Height: 5\' 6"  (1.676 m)   Body mass index is 22.66 kg/m. No exam data present  General: well developed, well nourished,  pleasant middle-age Caucasian female, seated, in no evident distress Cardiovascular: regular  rate and rhythm, no murmurs Vascular:  Normal pulses all extremities   Neurologic Exam Mental Status: Awake and fully alert.   Fluent speech and language.  Oriented to place and time. Recent and remote memory intact. Attention span, concentration and fund of knowledge appropriate. Mood and affect appropriate.  Cranial Nerves: Pupils equal, briskly reactive to light. Extraocular movements full without nystagmus. Visual fields full to confrontation. Hearing intact. Facial sensation intact.  Left nasolabial fold flattening Motor: Normal bulk and tone. Normal strength in all tested extremity muscles. Sensory.: intact to touch , pinprick , position and vibratory sensation.  Coordination: Rapid alternating  movements normal in all extremities. Finger-to-nose and heel-to-shin performed accurately bilaterally. Gait and Station: Arises from chair without difficulty. Stance is normal. Gait demonstrates normal stride length and balance Reflexes: 1+ and symmetric. Toes downgoing.        ASSESSMENT: Melissa Poole is a 49 y.o. year old female presented with left facial droop and numbness on 03/01/2020 with stroke work-up revealing right CR/SO infarct secondary to small vessel disease. Vascular risk factors include HTN and HLD.      PLAN:  1. Right CR/SO infarct:  -Residual deficits: Slight left nasolabial fold flattening.  No residual dysarthria -Continue aspirin 81 mg daily  and atorvastatin 80 mg daily for secondary stroke prevention. -Close PCP follow-up for aggressive stroke risk factor management 2. HTN: BP goal<130/90.  Stable today. f/u with PCP 3. HLD: LDL goal <70.  Patient reports recent LDL 58.  Continuation of atorvastatin 80 mg daily.  F/u with PCP for lipid panel monitoring and statin prescribing and management   Overall stable from stroke standpoint and recommend   I spent 20 minutes of face-to-face and non-face-to-face time with patient.  This included previsit chart review, lab  review, study review, order entry, electronic health record documentation, patient education regarding prior stroke, residual deficits, importance of stroke risk factor management and importance of ongoing medication compliance and answered all questions to patient satisfaction    Ihor Austin, AGNP-BC  [facility] [address] [place], [place] [address]  Phone [phone] Fax [phone] Note: This document was prepared with digital dictation and possible smart phrase technology. Any transcriptional errors that result from this process are unintentional.

## 2020-08-03 NOTE — Progress Notes (Signed)
I agree with the above plan 

## 2020-08-03 NOTE — Patient Instructions (Addendum)
Continue aspirin 81 mg daily  and atorvastatin 80 mg daily for secondary stroke prevention  Continue to follow up with PCP regarding blood pressure and cholesterol management  Maintain strict control of hypertension with blood pressure goal below 130/90, diabetes and cholesterol with LDL cholesterol (bad cholesterol) goal below 70 mg/dL.     Doing well from a stroke standpoint therefore recommend follow up as needed but please call with any questions or concerns       Thank you for coming to see [place] at [facility]. I hope we have been able to provide you high quality care today.  You may receive a patient satisfaction survey over the next few weeks. We would appreciate your feedback and comments so that we may continue to improve ourselves and the health of our patients.

## 2020-11-23 ENCOUNTER — Telehealth: Payer: Self-pay | Admitting: Adult Health

## 2020-11-23 NOTE — Telephone Encounter (Signed)
I called pt and relayed that per JM/NP that she would continue taking the aspirin 81mg  daily and see pcp for evalu possible ENT if continues.  She appreciated call.

## 2020-11-23 NOTE — Telephone Encounter (Signed)
Would advise her to continue aspirin usage and to further discuss with PCP for further evaluation and may potentially need referral to ENT

## 2020-11-23 NOTE — Telephone Encounter (Signed)
Pt left voicemail stating she has had an increase in nose bleeds and is concerned,pt states she has been taking her aspirin as suggested. Pt would like a call to discuss.

## 2020-11-23 NOTE — Telephone Encounter (Signed)
I called pt and she is concerned taking the baby aspirin is causing her to have nose bleeds. I relayed that due to colder, drier climate, use of allergy nasal sprays can definitely make conditions good for potential nosebleeds. She stated that she Bp is good.  Aspirin 81mg  daily is the least amount of blood thinner she could take. See pcp for evaluation. Use humidifier in bedroom at night. Nasal saline spray for moisturizing nasal passages.  She wanted me to let NP know.  No f/u scheduled.

## 2021-08-07 IMAGING — MR MR HEAD W/O CM
9 of 10 series · 35 of 48 positions shown · non-contrast
Comparison: None.

CLINICAL DATA: Left-sided facial droop

EXAM:
MRI HEAD WITHOUT CONTRAST
TECHNIQUE: Multiplanar, multiecho pulse sequences of the brain and surrounding
structures were obtained without intravenous contrast.

[Series 2: DWI · axial · 3.0mm · 0.94mm/px · z∈[-13,+133]mm · 8 of 100 slices shown (1 of 2)]
[im 1/100]
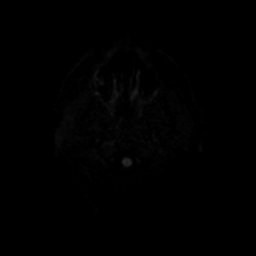
[im 12/100]
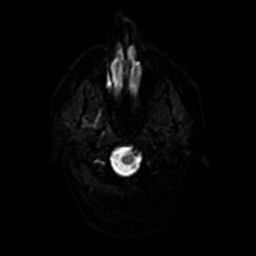
[im 34/100]
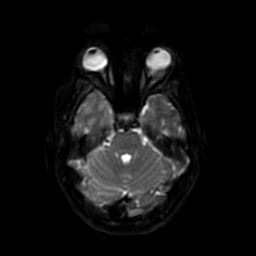
[im 45/100]
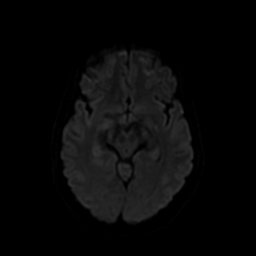
[im 56/100]
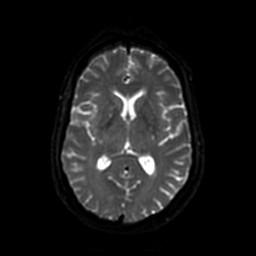
[im 67/100]
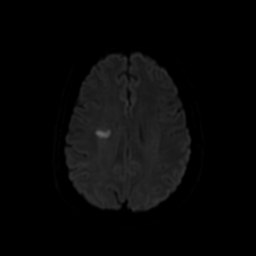
[im 89/100]
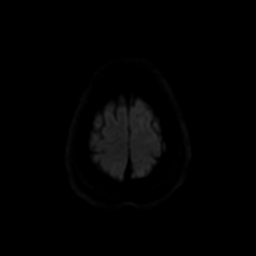
[im 100/100]
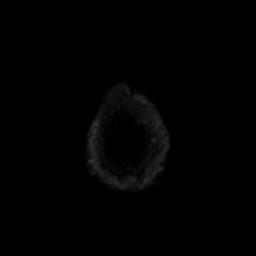

[Series 3: DWI · coronal · 4.0mm · 0.94mm/px · 7 of 74 slices shown (2 of 2)]
[im 1/74]
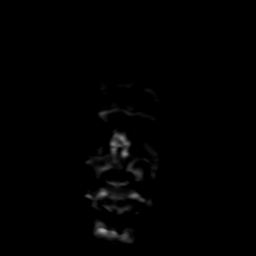
[im 13/74]
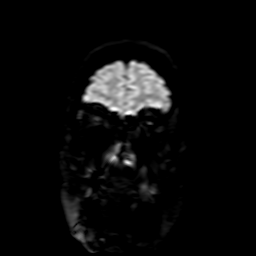
[im 25/74]
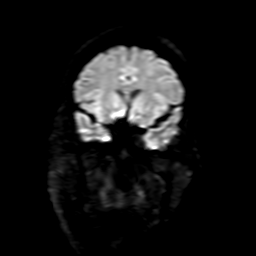
[im 37/74]
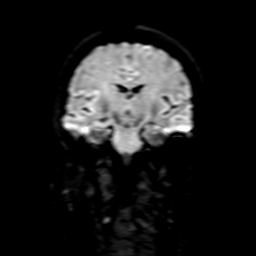
[im 49/74]
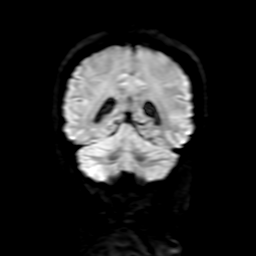
[im 61/74]
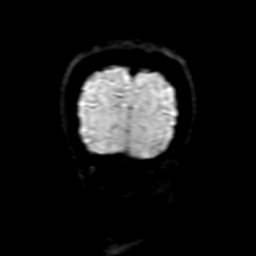
[im 74/74]
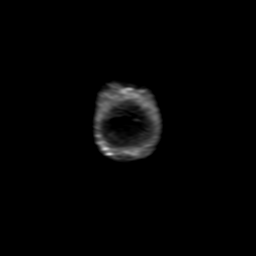

[Series 4: FLAIR · axial · 3.0mm · 0.41mm/px · z∈[-6,+136]mm · 2 of 25 slices shown (1 of 2)]
[im 1/25]
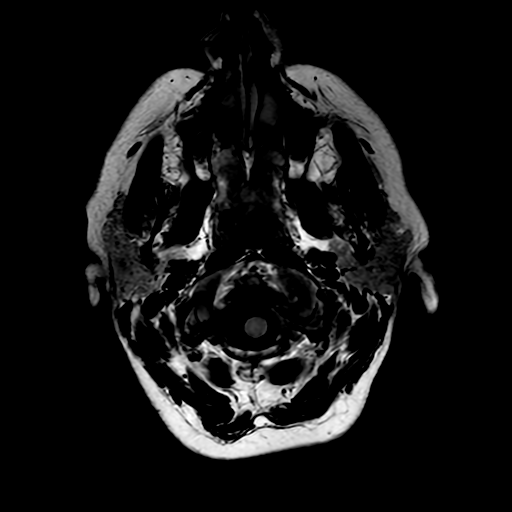
[im 25/25]
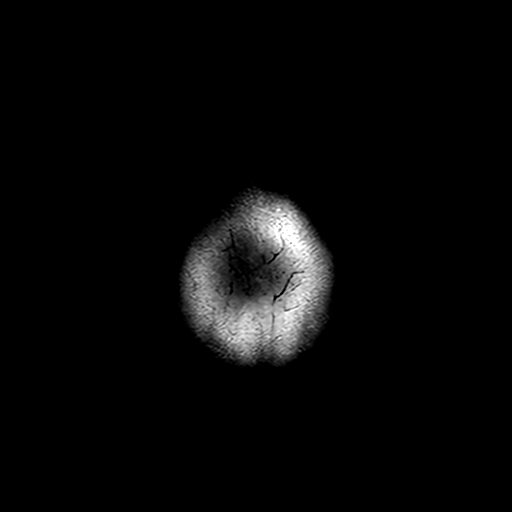

[Series 5: (person_name) · axial · 3.0mm · 0.47mm/px · z∈[-3,+32]mm · 3 of 100 slices shown]
[im 1/100]
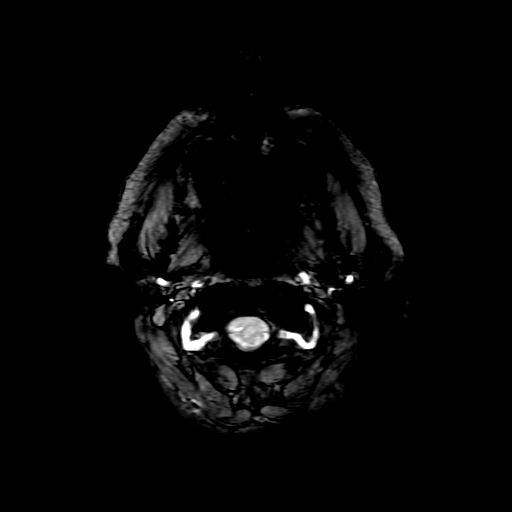
[im 13/100]
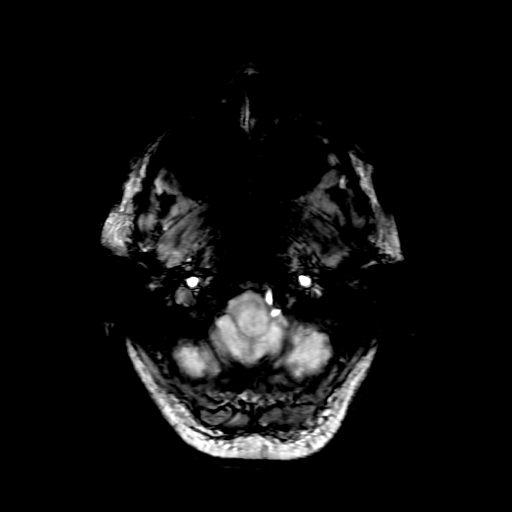
[im 25/100]
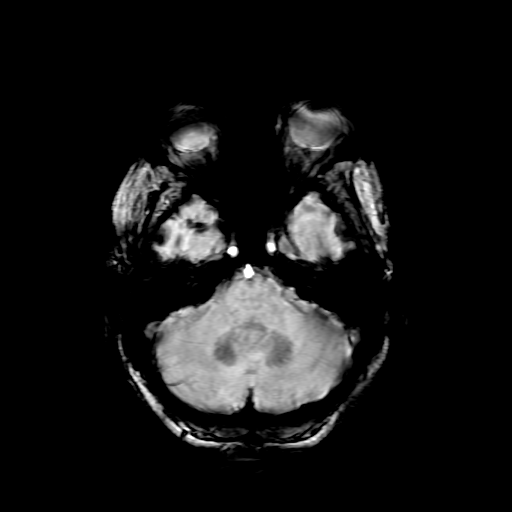

[Series 6: FLAIR · sagittal · 5.0mm · 0.47mm/px · 2 of 25 slices shown (2 of 2)]
[im 1/25]
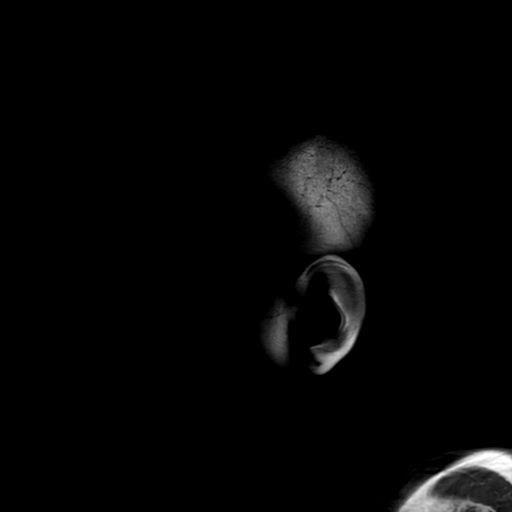
[im 25/25]
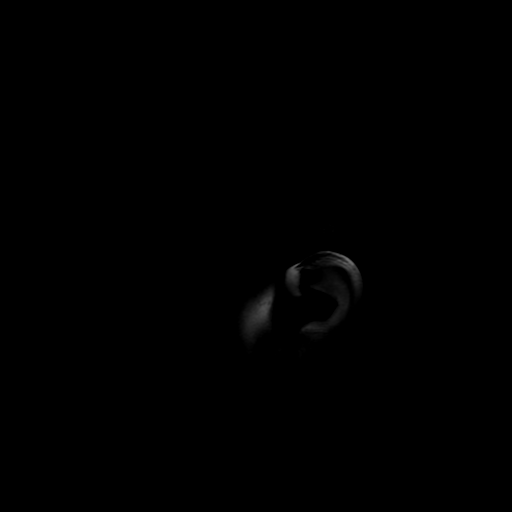

[Series 7: T2 · axial · 5.0mm · 0.41mm/px · z∈[-6,+136]mm · 2 of 25 slices shown (1 of 2)]
[im 1/25]
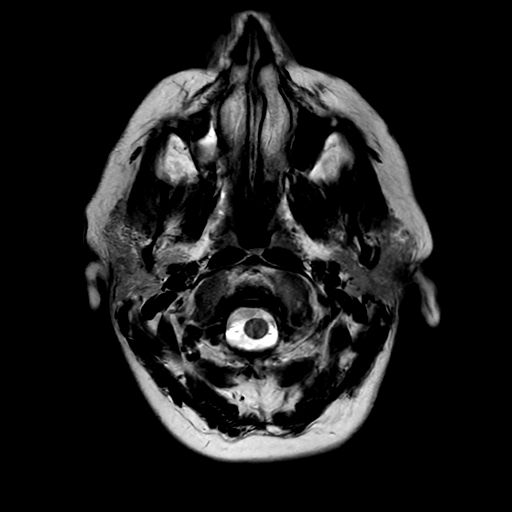
[im 25/25]
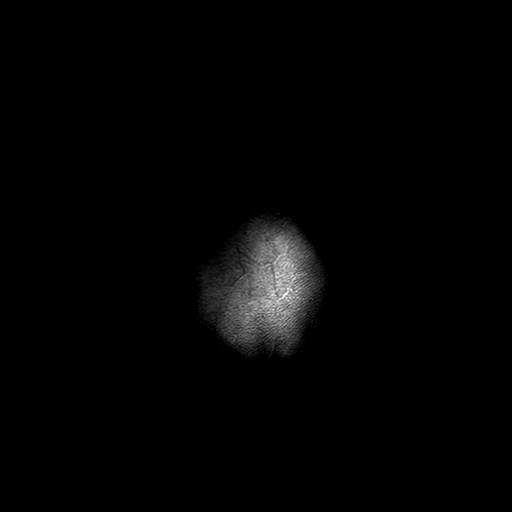

[Series 9: T2 · coronal · 5.0mm · 0.39mm/px · 3 of 31 slices shown (2 of 2)]
[im 1/31]
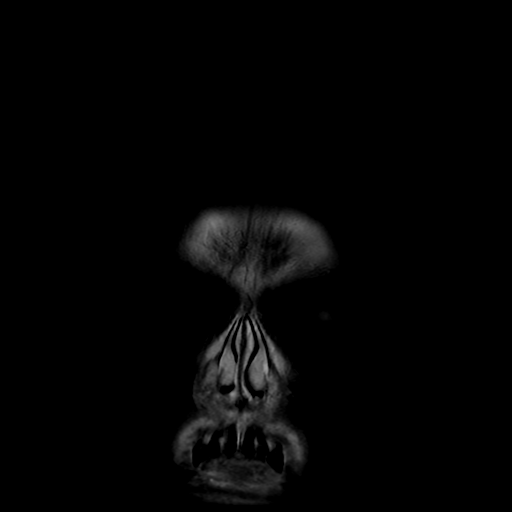
[im 16/31]
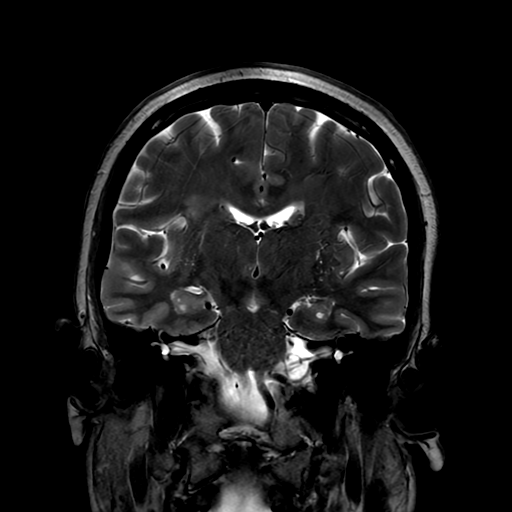
[im 31/31]
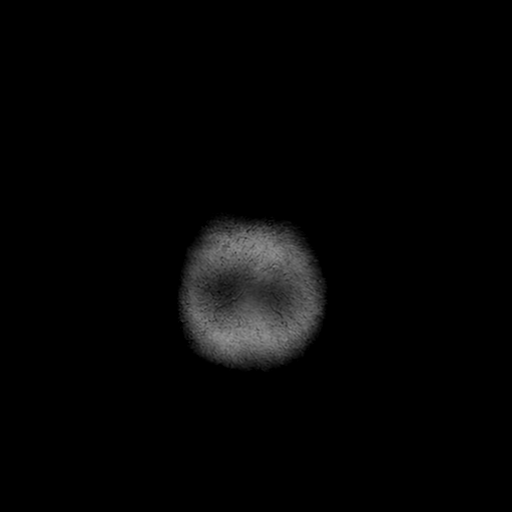

[Series 250: ADC · axial · 3.0mm · 0.94mm/px · z∈[-13,+133]mm · 5 of 50 slices shown (1 of 2)]
[im 1/50]
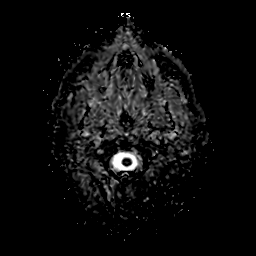
[im 13/50]
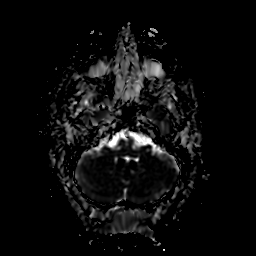
[im 25/50]
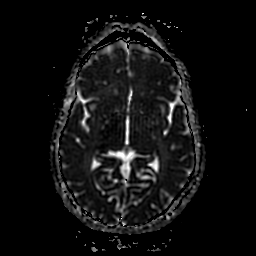
[im 37/50]
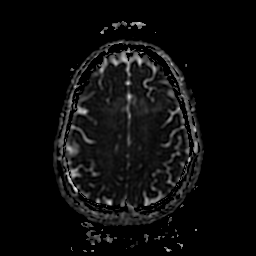
[im 50/50]
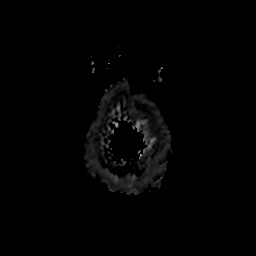

[Series 350: ADC · coronal · 4.0mm · 0.94mm/px · 3 of 37 slices shown (2 of 2)]
[im 1/37]
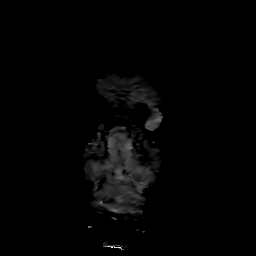
[im 19/37]
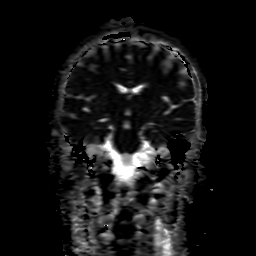
[im 37/37]
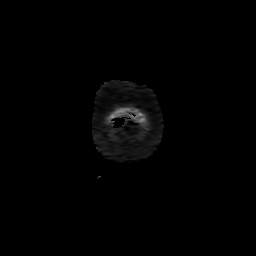

[35 of 48 positions shown; findings below may reference images not displayed]

FINDINGS: Brain: There is a 13 mm area of restricted diffusion within the
right centrum semiovale.

There is no evidence of intracranial hemorrhage. There is no
intracranial mass, mass effect, or edema. There is no hydrocephalus
or extra-axial fluid collection. Patchy T2 hyperintensity in the
supratentorial white matter is nonspecific but may reflect mild
chronic microvascular ischemic changes. Ventricles and sulci are
normal in size and configuration.

Vascular: Major vessel flow voids at the skull base are preserved.

Skull and upper cervical spine: Normal marrow signal is preserved.

Sinuses/Orbits: Trace mucosal thickening.  Orbits are unremarkable.

Other: Sella is unremarkable.  Mastoid air cells are clear.
IMPRESSION: Acute small vessel infarction involving the right centrum semiovale.

Mild chronic microvascular ischemic changes.

## 2022-08-03 ENCOUNTER — Other Ambulatory Visit: Payer: Self-pay

## 2022-08-03 ENCOUNTER — Ambulatory Visit
Admission: RE | Admit: 2022-08-03 | Discharge: 2022-08-03 | Disposition: A | Discharge status: Home / Self Care | Payer: Managed Care, Other (non HMO) | Source: Ambulatory Visit | Attending: Internal Medicine | Admitting: Internal Medicine

## 2022-08-03 VITALS — BP 120/67 | HR 95 | Temp 99.0°F | Resp 18

## 2022-08-03 DIAGNOSIS — U071 COVID-19: Secondary | ICD-10-CM | POA: Diagnosis not present

## 2022-08-03 MED ORDER — FLUTICASONE PROPIONATE 50 MCG/ACT NA SUSP: 1.0000 | Freq: Every day | NASAL | 0 refills | Status: AC

## 2022-08-03 MED ORDER — BENZONATATE 100 MG PO CAPS: 100.0000 mg | ORAL_CAPSULE | Freq: Three times a day (TID) | ORAL | 0 refills | Status: AC | PRN

## 2022-08-03 NOTE — ED Triage Notes (Signed)
Pt c/o sore throat, nasal congestion, headache, positive home covid test today.

## 2022-08-03 NOTE — Discharge Instructions (Signed)
Your covid test is pending. We Will call if it is positive. You have been sent two medications to alleviate symptoms. Please follow up if symptoms persist or worsen.

## 2022-08-03 NOTE — ED Provider Notes (Signed)
[facility]    CSN: [id number] Arrival date & time: 08/03/22  1453      History   Chief Complaint Chief Complaint  Patient presents with   covid(+)    HPI Celester Morgan is a 51 y.o. female.   Patient presents for further evaluation after having a home COVID test that was positive today.  Patient reports that she has a 2-day history of sore throat, nasal congestion, headache, cough.  Denies chest pain, shortness of breath, nausea, vomiting, diarrhea, abdominal pain.  Patient has taken ibuprofen for symptoms with minimal improvement.  Denies any known sick contacts or fever.     Past Medical History:  Diagnosis Date   Hypertension    Stroke [facility])     Patient Active Problem List   Diagnosis Date Noted   Abnormal urinalysis 03/02/2020   Hypertension    Ischemic stroke (HCC)    Hypokalemia     History reviewed. No pertinent surgical history.  OB History   No obstetric history on file.      Home Medications    Prior to Admission medications   Medication Sig Start Date End Date Taking? Authorizing Provider  benzonatate (TESSALON) 100 MG capsule Take 1 capsule (100 mg total) by mouth every 8 (eight) hours as needed for cough. 08/03/22  Yes Icelyn Navarrete, Rolly Salter E, FNP  fluticasone (FLONASE) 50 MCG/ACT nasal spray Place 1 spray into both nostrils daily for 3 days. 08/03/22 08/06/22 Yes Gustavus Bryant, FNP  aspirin EC 81 MG EC tablet Take 1 tablet (81 mg total) by mouth daily. 03/03/20   Black, Lesle Chris, NP  atorvastatin (LIPITOR) 80 MG tablet Take 1 tablet (80 mg total) by mouth daily at 6 PM. 03/02/20   Black, Lesle Chris, NP  Glucosamine-Chondroit-Vit C-Mn (GLUCOSAMINE 1500 COMPLEX) CAPS Take by mouth.    [provider]  labetalol (NORMODYNE) 100 MG tablet Take 100 mg by mouth 2 (two) times daily.    [provider]  Multiple Vitamins-Minerals (MULTI-DAY PLUS MINERALS PO) Take by mouth.    [provider]  Omega-3 Fatty Acids (FISH OIL) 1000 MG CAPS  Take by mouth.    [provider]  Oxymetazoline HCl (NASAL SPRAY NA) Place 1 spray into the nose daily as needed (allergies).    [provider]  Turmeric (QC TUMERIC COMPLEX PO) Take by mouth.    [provider]  Vitamin D, Cholecalciferol, 50 MCG (2000 UT) CAPS Take by mouth.    [provider]    Family History History reviewed. No pertinent family history.  Social History Social History   Tobacco Use   Smoking status: Never   Smokeless tobacco: Never     Allergies   Demerol [meperidine]   Review of Systems Review of Systems Per HPI  Physical Exam Triage Vital Signs ED Triage Vitals [08/03/22 1505]  Enc Vitals Group     BP 120/67     Pulse Rate 95     Resp 18     Temp 99 F (37.2 C)     Temp Source Oral     SpO2 97 %     Weight      Height      Head Circumference      Peak Flow      Pain Score 0     Pain Loc      Pain Edu?      Excl. in GC?    No data found.  Updated Vital Signs  BP 120/67 (BP Location: Right Arm)   Pulse 95   Temp 99 F (37.2 C) (Oral)   Resp 18   SpO2 97%   Visual Acuity Right Eye Distance:   Left Eye Distance:   Bilateral Distance:    Right Eye Near:   Left Eye Near:    Bilateral Near:     Physical Exam Constitutional:      General: She is not in acute distress.    Appearance: Normal appearance. She is not toxic-appearing or diaphoretic.  HENT:     Head: Normocephalic and atraumatic.     Right Ear: Tympanic membrane and ear canal normal.     Left Ear: Tympanic membrane and ear canal normal.     Nose: Congestion present.     Mouth/Throat:     Mouth: Mucous membranes are moist.     Pharynx: Posterior oropharyngeal erythema present.  Eyes:     Extraocular Movements: Extraocular movements intact.     Conjunctiva/sclera: Conjunctivae normal.     Pupils: Pupils are equal, round, and reactive to light.  Cardiovascular:     Rate and Rhythm: Normal rate and regular rhythm.     Pulses:  Normal pulses.     Heart sounds: Normal heart sounds.  Pulmonary:     Effort: Pulmonary effort is normal. No respiratory distress.     Breath sounds: Normal breath sounds. No stridor. No wheezing, rhonchi or rales.  Abdominal:     General: Abdomen is flat. Bowel sounds are normal.     Palpations: Abdomen is soft.  Musculoskeletal:        General: Normal range of motion.     Cervical back: Normal range of motion.  Skin:    General: Skin is warm and dry.  Neurological:     General: No focal deficit present.     Mental Status: She is alert and oriented to person, place, and time. Mental status is at baseline.  Psychiatric:        Mood and Affect: Mood normal.        Behavior: Behavior normal.      UC Treatments / Results  Labs (all labs ordered are listed, but only abnormal results are displayed) Labs Reviewed  NOVEL CORONAVIRUS, NAA    EKG   Radiology No results found.  Procedures Procedures (including critical care time)  Medications Ordered in UC Medications - No data to display  Initial Impression / Assessment and Plan / UC Course  I have reviewed the triage vital signs and the nursing notes.  Pertinent labs & imaging results that were available during my care of the patient were reviewed by me and considered in my medical decision making (see chart for details).     Patient had COVID test at home that was positive.  Patient requesting PCR COVID test for employer.  COVID PCR pending.  Discussed antiviral medications for COVID with patient but patient declined COVID antivirals.  Discussed supportive care and symptom management with patient.  Patient sent prescriptions for symptoms.  Discussed return precautions.  Patient verbalized understanding and was agreeable with plan. Final Clinical Impressions(s) / UC Diagnoses   Final diagnoses:  COVID-19     Discharge Instructions      Your covid test is pending. We Will call if it is positive. You have been sent  two medications to alleviate symptoms. Please follow up if symptoms persist or worsen.     ED Prescriptions     Medication Sig Dispense Auth. Provider  fluticasone (FLONASE) 50 MCG/ACT nasal spray Place 1 spray into both nostrils daily for 3 days. 16 g Macario Shear, Hildred Alamin E, [place]   benzonatate (TESSALON) 100 MG capsule Take 1 capsule (100 mg total) by mouth every 8 (eight) hours as needed for cough. 21 capsule [place], Michele Rockers, [place]      PDMP not reviewed this encounter.   Teodora Medici,  08/03/22 [phone]

## 2022-08-04 LAB — NOVEL CORONAVIRUS, NAA: SARS-CoV-2, NAA: DETECTED — AB

## 2023-03-07 ENCOUNTER — Other Ambulatory Visit (HOSPITAL_COMMUNITY)
Admission: RE | Admit: 2023-03-07 | Discharge: 2023-03-07 | Disposition: A | Discharge status: Home / Self Care | Payer: Managed Care, Other (non HMO) | Source: Ambulatory Visit | Attending: Nurse Practitioner | Admitting: Nurse Practitioner

## 2023-03-07 ENCOUNTER — Other Ambulatory Visit: Payer: Self-pay | Admitting: Nurse Practitioner

## 2023-03-07 DIAGNOSIS — R87612 Low grade squamous intraepithelial lesion on cytologic smear of cervix (LGSIL): Secondary | ICD-10-CM | POA: Insufficient documentation

## 2023-03-09 LAB — CYTOLOGY - PAP
Comment: NEGATIVE
Diagnosis: NEGATIVE
High risk HPV: NEGATIVE
